# Patient Record
Sex: Female | Born: 1995 | Race: White | Hispanic: No | Marital: Married | Smoking: Current some day smoker
Health system: Southern US, Community
[De-identification: ages and names within clinical notes are randomized; demographics above are authoritative.]

## PROBLEM LIST (undated history)

## (undated) DIAGNOSIS — Z789 Other specified health status: Secondary | ICD-10-CM

## (undated) DIAGNOSIS — Z3490 Encounter for supervision of normal pregnancy, unspecified, unspecified trimester: Secondary | ICD-10-CM

## (undated) HISTORY — PX: NO PAST SURGERIES: SHX2092

## (undated) HISTORY — DX: Encounter for supervision of normal pregnancy, unspecified, unspecified trimester: Z34.90

## (undated) HISTORY — DX: Other specified health status: Z78.9

---
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
## 2015-01-21 IMAGING — US US TRANSVAGINAL NON-OB
1 series · 13 of 25 positions shown · non-contrast
Comparison: None

CLINICAL DATA: Pelvic pain. Elevated white cell count. Right lower
quadrant pain for 1 week.

EXAM:
TRANSABDOMINAL AND TRANSVAGINAL ULTRASOUND OF PELVIS
TECHNIQUE: Both transabdominal and transvaginal ultrasound examinations of the
pelvis were performed. Transabdominal technique was performed for
global imaging of the pelvis including uterus, ovaries, adnexal
regions, and pelvic cul-de-sac. It was necessary to proceed with
endovaginal exam following the transabdominal exam to visualize the
endometrium and ovaries.

[Series 1: us transvaginal non-ob · 0.15mm/px · 13 of 65 slices shown]
[im 1/65]
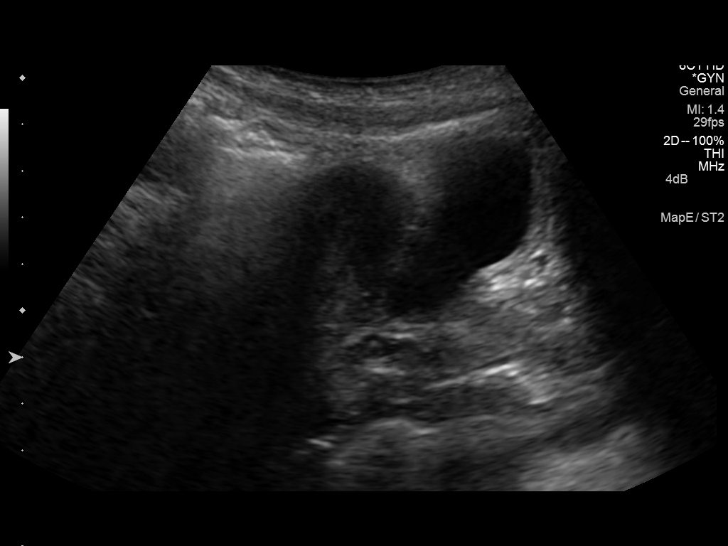
[im 6/65]
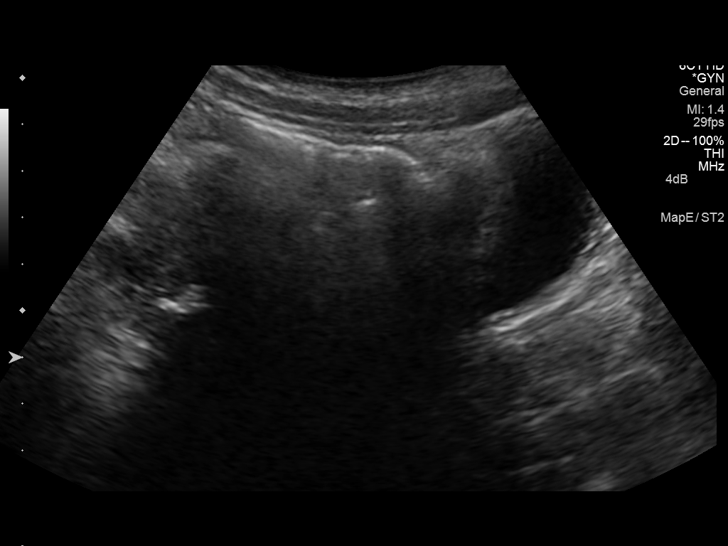
[im 11/65]
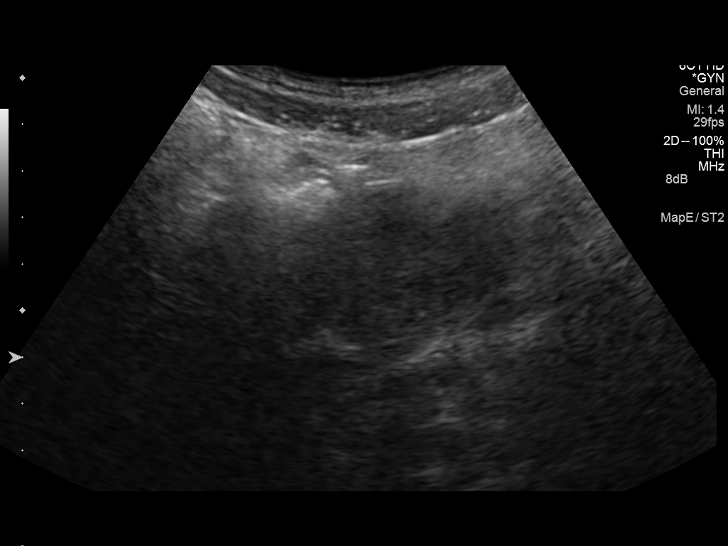
[im 17/65]
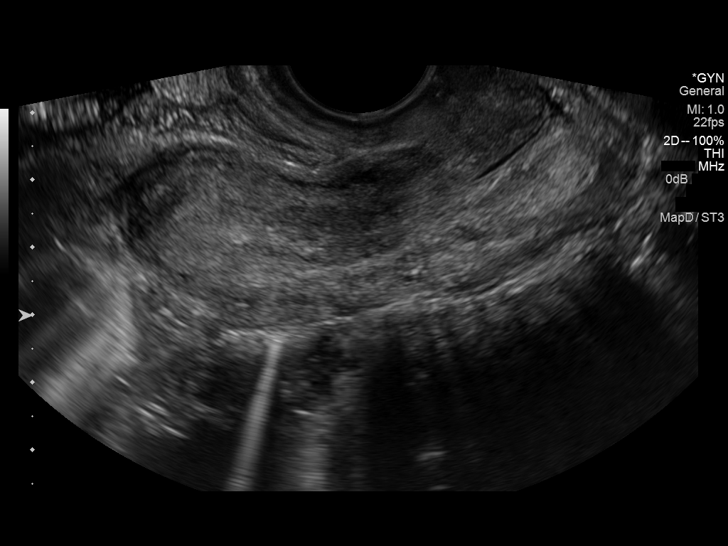
[im 22/65]
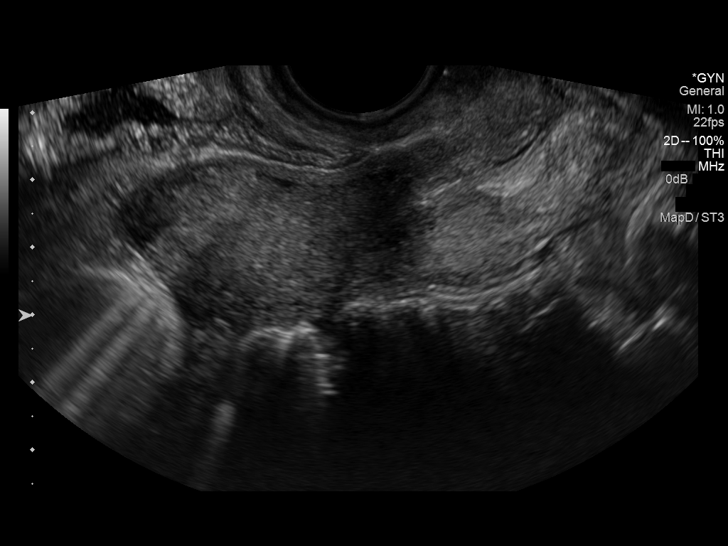
[im 27/65]
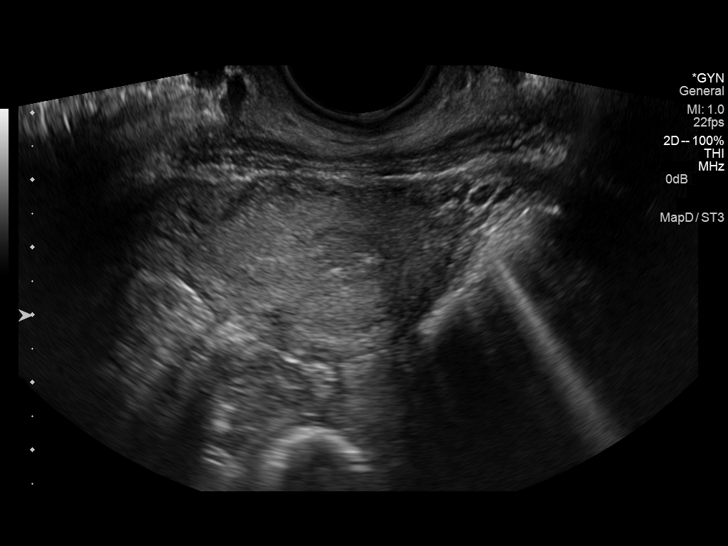
[im 33/65]
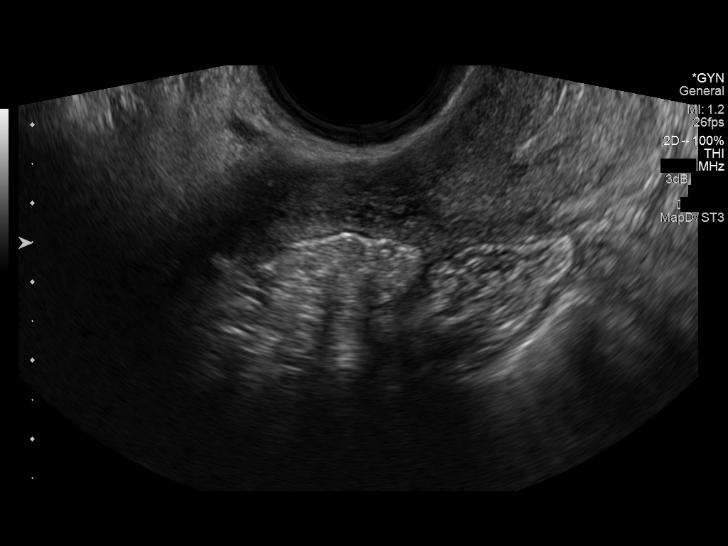
[im 38/65]
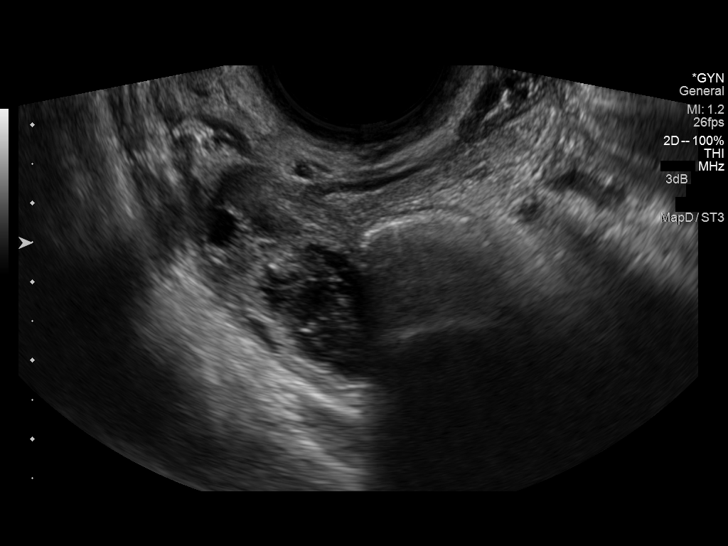
[im 43/65]
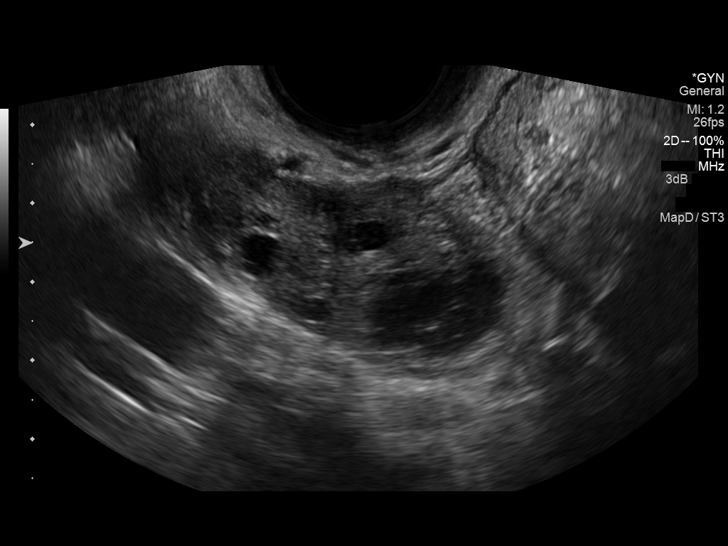
[im 49/65]
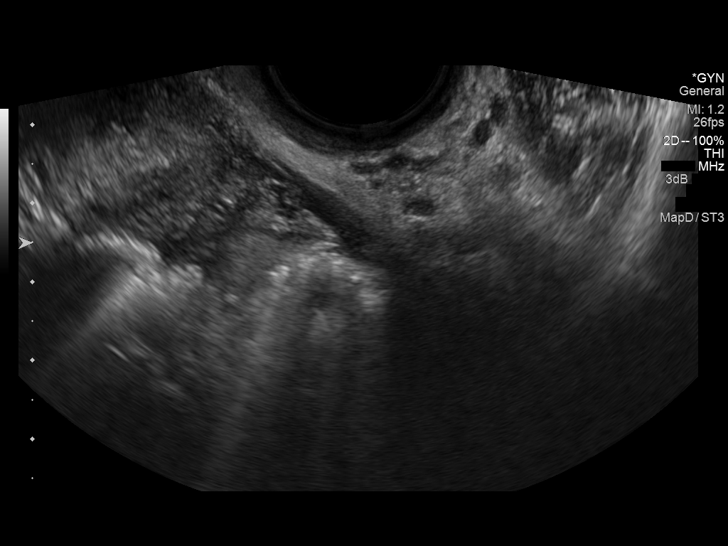
[im 54/65]
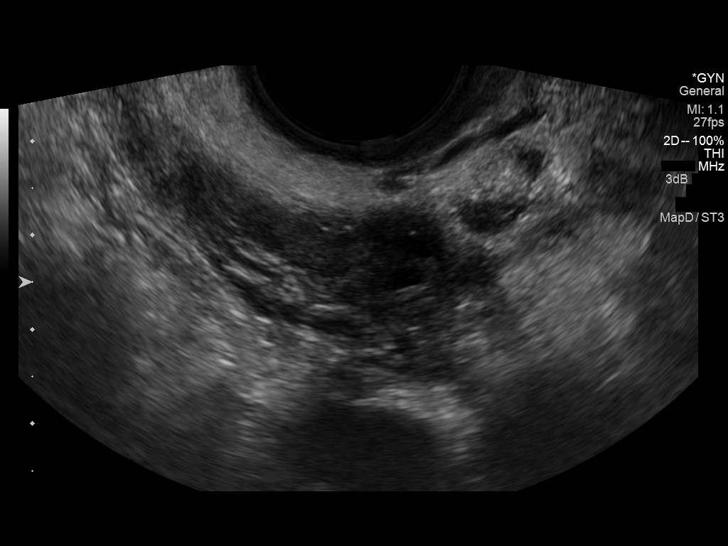
[im 59/65]
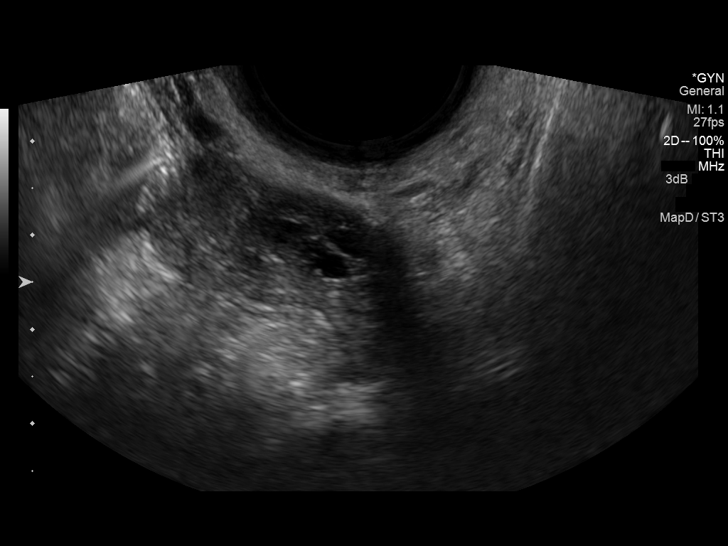
[im 65/65]
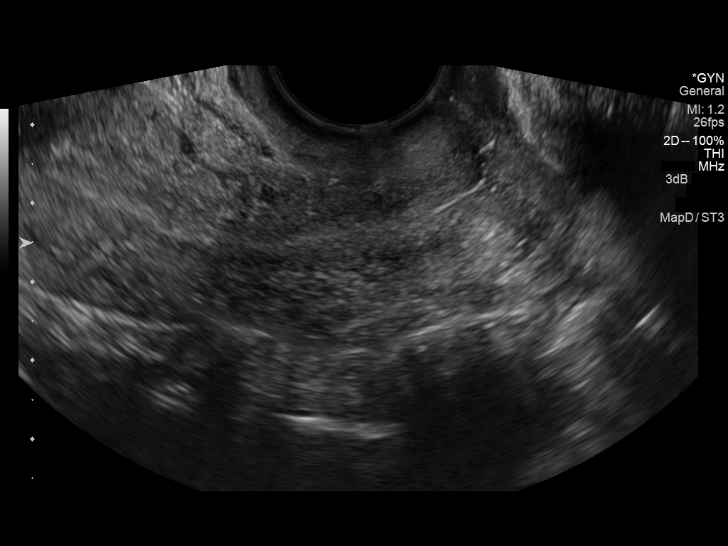

[13 of 25 positions shown; findings below may reference images not displayed]

FINDINGS: Measurements: 7.4 x 2.7 x 4.2 cm, anteverted. No fibroids or other
mass visualized.

Endometrium

Thickness: 6.4 mm.  No focal abnormality visualized.

Right ovary

Measurements: 4.3 x 2.2 x 2.5 cm. Dominant complex cyst or follicle
with diffuse low-level echoes measuring 2.2 cm maximal diameter.
This is likely a hemorrhagic cyst. Due to size and patient's age, no
follow-up necessary. Normal appearance/no adnexal mass.

Left ovary

Measurements: 3 x 1.8 x 1.8 cm.. Normal appearance/no adnexal mass.

Other findings

No free fluid.
IMPRESSION: Hemorrhagic cyst in the right ovary. Uterus and ovaries otherwise
normal.

## 2015-12-18 ENCOUNTER — Encounter: Payer: Self-pay | Admitting: Adult Health

## 2015-12-18 ENCOUNTER — Ambulatory Visit (INDEPENDENT_AMBULATORY_CARE_PROVIDER_SITE_OTHER): Payer: BLUE CROSS/BLUE SHIELD | Admitting: Adult Health

## 2015-12-18 VITALS — BP 100/50 | HR 92 | Ht 66.0 in | Wt 106.5 lb

## 2015-12-18 DIAGNOSIS — Z3490 Encounter for supervision of normal pregnancy, unspecified, unspecified trimester: Secondary | ICD-10-CM

## 2015-12-18 DIAGNOSIS — Z3201 Encounter for pregnancy test, result positive: Secondary | ICD-10-CM | POA: Diagnosis not present

## 2015-12-18 DIAGNOSIS — O3680X0 Pregnancy with inconclusive fetal viability, not applicable or unspecified: Secondary | ICD-10-CM

## 2015-12-18 HISTORY — DX: Encounter for supervision of normal pregnancy, unspecified, unspecified trimester: Z34.90

## 2015-12-18 LAB — POCT URINE PREGNANCY: Preg Test, Ur: POSITIVE — AB

## 2015-12-18 MED ORDER — PRENATAL PLUS 27-1 MG PO TABS: 1.0000 | ORAL_TABLET | Freq: Every day | ORAL | 12 refills | Status: DC

## 2015-12-18 NOTE — Progress Notes (Signed)
Subjective:     Patient ID: Joann Duncan Duncan, female   DOB: 12/02/1995, 20 y.o.   MRN: [id number]  HPI Joann Duncan is a recently married 20 year old white female in for UPT,she has no complaints.  Review of Systems Patient denies any headaches, hearing loss, fatigue, blurred vision, shortness of breath, chest pain, abdominal pain, problems with bowel movements, urination, or intercourse. No joint pain or mood swings.Reviewed past medical,surgical, social and family history. Reviewed medications and allergies.     Objective:   Physical Exam BP (!) 100/50 (BP Location: Left Arm, Patient Position: Sitting, Cuff Size: Normal)   Pulse 92   Ht 5\' 6"  (1.676 m)   Wt 106 lb 8 oz (48.3 kg)   LMP 10/20/2015   BMI 17.19 kg/m  UPT +,about 8+3 weeks by LMP with EDD 07/26/16, medicaid form given,Skin warm and dry. Neck: mid line trachea, normal thyroid, good ROM, no lymphadenopathy noted. Lungs: clear to ausculation bilaterally. Cardiovascular: regular rate and rhythm.Abdomen is soft and non tender.Try to starting decreasing cigarettes and then stop.    Assessment:     UPT + Pregnant     Plan:     Rx prenatal plus #30 take 1 daily with 1refill Return in 1 week for dating US Review handout on first trimester

## 2015-12-18 NOTE — Patient Instructions (Signed)
First Trimester of Pregnancy The first trimester of pregnancy is from week 1 until the end of week 12 (months 1 through 3). A week after a sperm fertilizes an egg, the egg will implant on the wall of the uterus. This embryo will begin to develop into a baby. Genes from you and your partner are forming the baby. The female genes determine whether the baby is a boy or a girl. At 6-8 weeks, the eyes and face are formed, and the heartbeat can be seen on ultrasound. At the end of 12 weeks, all the baby's organs are formed.  Now that you are pregnant, you will want to do everything you can to have a healthy baby. Two of the most important things are to get good prenatal care and to follow your health care provider's instructions. Prenatal care is all the medical care you receive before the baby's birth. This care will help prevent, find, and treat any problems during the pregnancy and childbirth. BODY CHANGES Your body goes through many changes during pregnancy. The changes vary from woman to woman.   You may gain or lose a couple of pounds at first.  You may feel sick to your stomach (nauseous) and throw up (vomit). If the vomiting is uncontrollable, call your health care provider.  You may tire easily.  You may develop headaches that can be relieved by medicines approved by your health care provider.  You may urinate more often. Painful urination may mean you have a bladder infection.  You may develop heartburn as a result of your pregnancy.  You may develop constipation because certain hormones are causing the muscles that push waste through your intestines to slow down.  You may develop hemorrhoids or swollen, bulging veins (varicose veins).  Your breasts may begin to grow larger and become tender. Your nipples may stick out more, and the tissue that surrounds them (areola) may become darker.  Your gums may bleed and may be sensitive to brushing and flossing.  Dark spots or blotches (chloasma,  mask of pregnancy) may develop on your face. This will likely fade after the baby is born.  Your menstrual periods will stop.  You may have a loss of appetite.  You may develop cravings for certain kinds of food.  You may have changes in your emotions from day to day, such as being excited to be pregnant or being concerned that something may go wrong with the pregnancy and baby.  You may have more vivid and strange dreams.  You may have changes in your hair. These can include thickening of your hair, rapid growth, and changes in texture. Some women also have hair loss during or after pregnancy, or hair that feels dry or thin. Your hair will most likely return to normal after your baby is born. WHAT TO EXPECT AT YOUR PRENATAL VISITS During a routine prenatal visit:  You will be weighed to make sure you and the baby are growing normally.  Your blood pressure will be taken.  Your abdomen will be measured to track your baby's growth.  The fetal heartbeat will be listened to starting around week 10 or 12 of your pregnancy.  Test results from any previous visits will be discussed. Your health care provider may ask you:  How you are feeling.  If you are feeling the baby move.  If you have had any abnormal symptoms, such as leaking fluid, bleeding, severe headaches, or abdominal cramping.  If you are using any tobacco products,   including cigarettes, chewing tobacco, and electronic cigarettes.  If you have any questions. Other tests that may be performed during your first trimester include:  Blood tests to find your blood type and to check for the presence of any previous infections. They will also be used to check for low iron levels (anemia) and Rh antibodies. Later in the pregnancy, blood tests for diabetes will be done along with other tests if problems develop.  Urine tests to check for infections, diabetes, or protein in the urine.  An ultrasound to confirm the proper growth  and development of the baby.  An amniocentesis to check for possible genetic problems.  Fetal screens for spina bifida and Down syndrome.  You may need other tests to make sure you and the baby are doing well.  HIV (human immunodeficiency virus) testing. Routine prenatal testing includes screening for HIV, unless you choose not to have this test. HOME CARE INSTRUCTIONS  Medicines  Follow your health care provider's instructions regarding medicine use. Specific medicines may be either safe or unsafe to take during pregnancy.  Take your prenatal vitamins as directed.  If you develop constipation, try taking a stool softener if your health care provider approves. Diet  Eat regular, well-balanced meals. Choose a variety of foods, such as meat or vegetable-based protein, fish, milk and low-fat dairy products, vegetables, fruits, and whole grain breads and cereals. Your health care provider will help you determine the amount of weight gain that is right for you.  Avoid raw meat and uncooked cheese. These carry germs that can cause birth defects in the baby.  Eating four or five small meals rather than three large meals a day may help relieve nausea and vomiting. If you start to feel nauseous, eating a few soda crackers can be helpful. Drinking liquids between meals instead of during meals also seems to help nausea and vomiting.  If you develop constipation, eat more high-fiber foods, such as fresh vegetables or fruit and whole grains. Drink enough fluids to keep your urine clear or pale yellow. Activity and Exercise  Exercise only as directed by your health care provider. Exercising will help you:  Control your weight.  Stay in shape.  Be prepared for labor and delivery.  Experiencing pain or cramping in the lower abdomen or low back is a good sign that you should stop exercising. Check with your health care provider before continuing normal exercises.  Try to avoid standing for long  periods of time. Move your legs often if you must stand in one place for a long time.  Avoid heavy lifting.  Wear low-heeled shoes, and practice good posture.  You may continue to have sex unless your health care provider directs you otherwise. Relief of Pain or Discomfort  Wear a good support bra for breast tenderness.   Take warm sitz baths to soothe any pain or discomfort caused by hemorrhoids. Use hemorrhoid cream if your health care provider approves.   Rest with your legs elevated if you have leg cramps or low back pain.  If you develop varicose veins in your legs, wear support hose. Elevate your feet for 15 minutes, 3-4 times a day. Limit salt in your diet. Prenatal Care  Schedule your prenatal visits by the twelfth week of pregnancy. They are usually scheduled monthly at first, then more often in the last 2 months before delivery.  Write down your questions. Take them to your prenatal visits.  Keep all your prenatal visits as directed by your   health care provider. Safety  Wear your seat belt at all times when driving.  Make a list of emergency phone numbers, including numbers for family, friends, the hospital, and police and fire departments. General Tips  Ask your health care provider for a referral to a local prenatal education class. Begin classes no later than at the beginning of month 6 of your pregnancy.  Ask for help if you have counseling or nutritional needs during pregnancy. Your health care provider can offer advice or refer you to specialists for help with various needs.  Do not use hot tubs, steam rooms, or saunas.  Do not douche or use tampons or scented sanitary pads.  Do not cross your legs for long periods of time.  Avoid cat litter boxes and soil used by cats. These carry germs that can cause birth defects in the baby and possibly loss of the fetus by miscarriage or stillbirth.  Avoid all smoking, herbs, alcohol, and medicines not prescribed by  your health care provider. Chemicals in these affect the formation and growth of the baby.  Do not use any tobacco products, including cigarettes, chewing tobacco, and electronic cigarettes. If you need help quitting, ask your health care provider. You may receive counseling support and other resources to help you quit.  Schedule a dentist appointment. At home, brush your teeth with a soft toothbrush and be gentle when you floss. SEEK MEDICAL CARE IF:   You have dizziness.  You have mild pelvic cramps, pelvic pressure, or nagging pain in the abdominal area.  You have persistent nausea, vomiting, or diarrhea.  You have a bad smelling vaginal discharge.  You have pain with urination.  You notice increased swelling in your face, hands, legs, or ankles. SEEK IMMEDIATE MEDICAL CARE IF:   You have a fever.  You are leaking fluid from your vagina.  You have spotting or bleeding from your vagina.  You have severe abdominal cramping or pain.  You have rapid weight gain or loss.  You vomit blood or material that looks like coffee grounds.  You are exposed to German measles and have never had them.  You are exposed to fifth disease or chickenpox.  You develop a severe headache.  You have shortness of breath.  You have any kind of trauma, such as from a fall or a car accident.   This information is not intended to replace advice given to you by your health care provider. Make sure you discuss any questions you have with your health care provider.   Document Released: 04/15/2001 Document Revised: 05/12/2014 Document Reviewed: 03/01/2013 Elsevier Interactive Patient Education 2016 [employer] Return in 1 week for [place] 

## 2015-12-24 ENCOUNTER — Ambulatory Visit (INDEPENDENT_AMBULATORY_CARE_PROVIDER_SITE_OTHER): Payer: BLUE CROSS/BLUE SHIELD

## 2015-12-24 DIAGNOSIS — Z3A01 Less than 8 weeks gestation of pregnancy: Secondary | ICD-10-CM

## 2015-12-24 DIAGNOSIS — O3680X0 Pregnancy with inconclusive fetal viability, not applicable or unspecified: Secondary | ICD-10-CM | POA: Diagnosis not present

## 2015-12-24 NOTE — Progress Notes (Signed)
US 6+2 wks,single IUP w/ys,pos fht 129 bpm,normal rt ov,unable to see lt ov,crl 6.0 mm

## 2015-12-25 ENCOUNTER — Ambulatory Visit (INDEPENDENT_AMBULATORY_CARE_PROVIDER_SITE_OTHER): Payer: BLUE CROSS/BLUE SHIELD | Admitting: Women's Health

## 2015-12-25 ENCOUNTER — Encounter: Payer: Self-pay | Admitting: Women's Health

## 2015-12-25 VITALS — BP 124/72 | HR 84 | Wt 107.0 lb

## 2015-12-25 DIAGNOSIS — Z3481 Encounter for supervision of other normal pregnancy, first trimester: Secondary | ICD-10-CM | POA: Diagnosis not present

## 2015-12-25 DIAGNOSIS — Z1389 Encounter for screening for other disorder: Secondary | ICD-10-CM

## 2015-12-25 DIAGNOSIS — Z0283 Encounter for blood-alcohol and blood-drug test: Secondary | ICD-10-CM

## 2015-12-25 DIAGNOSIS — O99331 Smoking (tobacco) complicating pregnancy, first trimester: Secondary | ICD-10-CM | POA: Diagnosis not present

## 2015-12-25 DIAGNOSIS — F17200 Nicotine dependence, unspecified, uncomplicated: Secondary | ICD-10-CM | POA: Insufficient documentation

## 2015-12-25 DIAGNOSIS — Z3A01 Less than 8 weeks gestation of pregnancy: Secondary | ICD-10-CM | POA: Diagnosis not present

## 2015-12-25 DIAGNOSIS — Z369 Encounter for antenatal screening, unspecified: Secondary | ICD-10-CM

## 2015-12-25 DIAGNOSIS — Z3491 Encounter for supervision of normal pregnancy, unspecified, first trimester: Secondary | ICD-10-CM

## 2015-12-25 DIAGNOSIS — F17210 Nicotine dependence, cigarettes, uncomplicated: Secondary | ICD-10-CM

## 2015-12-25 DIAGNOSIS — Z3490 Encounter for supervision of normal pregnancy, unspecified, unspecified trimester: Secondary | ICD-10-CM | POA: Insufficient documentation

## 2015-12-25 DIAGNOSIS — Z331 Pregnant state, incidental: Secondary | ICD-10-CM

## 2015-12-25 LAB — POCT URINALYSIS DIPSTICK
Blood, UA: NEGATIVE
Glucose, UA: NEGATIVE
KETONES UA: NEGATIVE
Leukocytes, UA: NEGATIVE
Nitrite, UA: NEGATIVE

## 2015-12-25 NOTE — Progress Notes (Signed)
  Subjective:  Joann Duncan is a 20 y.o. 643P0020 Caucasian female at 7032w3d by 6wk u/s, being seen today for her first obstetrical visit.  Her obstetrical history is significant for SAB x 2, smoker: 1/2ppd prior to pregnancy, now ~2 cigarettes/day. Pregnancy history fully reviewed.  Patient reports no complaints. Denies vb, cramping, uti s/s, abnormal/malodorous vag d/c, or vulvovaginal itching/irritation.  BP 124/72   Pulse 84   Wt 107 lb (48.5 kg)   LMP 10/20/2015 (Exact Date)   BMI 17.27 kg/m   HISTORY: OB History  Gravida Para Term Preterm AB Living  3       2    SAB TAB Ectopic Multiple Live Births  2            # Outcome Date GA Lbr Len/2nd Weight Sex Delivery Anes PTL Lv  3 Current           2 SAB 05/01/15          1 SAB 02/16/15             Past Medical History:  Diagnosis Date  . Medical history non-contributory   . Pregnant 12/18/2015   Past Surgical History:  Procedure Laterality Date  . NO PAST SURGERIES     Family History  Problem Relation Age of Onset  . Cancer Paternal Grandfather     Exam   System:     General: Well developed & nourished, no acute distress   Skin: Warm & dry, normal coloration and turgor, no rashes   Neurologic: Alert & oriented, normal mood   Cardiovascular: Regular rate & rhythm   Respiratory: Effort & rate normal, LCTAB, acyanotic   Abdomen: Soft, non tender   Extremities: normal strength, tone   Pelvic Exam:    Perineum: Normal perineum   Vulva: Normal, no lesions   Vagina:  Normal mucosa, normal discharge   Cervix: Normal, bulbous, appears closed   Uterus: Normal size/shape/contour for GA   Thin prep pap smear <21yo    Assessment:   Pregnancy: [id number] Patient Active Problem List   Diagnosis Date Noted  . Pregnant 12/18/2015    2532w3d G3P0020 New OB visit Smoker H/O SAB x 2  Plan:  Initial labs drawn Continue prenatal vitamins Problem list reviewed and updated Reviewed n/v relief measures and warning s/s  to report Reviewed recommended weight gain based on pre-gravid BMI Encouraged well-balanced diet Genetic Screening discussed Integrated Screen: requested Cystic fibrosis screening discussed declined Ultrasound discussed; fetal survey: requested Follow up in 4 weeks for visit [facility] completed NFPartnership offered, accepted, referral faxed  Smokes 2 cigarettes/day, advised cessation, discussed risks to fetus while pregnant, to infant pp, and to herself.   Joann Duncan, Joann Duncan CNM, [facility]NP[facility]BC 12/25/2015 9:07 AM

## 2015-12-25 NOTE — Patient Instructions (Signed)

## 2015-12-26 ENCOUNTER — Encounter: Payer: Self-pay | Admitting: Women's Health

## 2015-12-26 DIAGNOSIS — O9989 Other specified diseases and conditions complicating pregnancy, childbirth and the puerperium: Secondary | ICD-10-CM

## 2015-12-26 DIAGNOSIS — O09899 Supervision of other high risk pregnancies, unspecified trimester: Secondary | ICD-10-CM | POA: Insufficient documentation

## 2015-12-26 DIAGNOSIS — Z283 Underimmunization status: Secondary | ICD-10-CM | POA: Insufficient documentation

## 2015-12-26 LAB — GC/CHLAMYDIA PROBE AMP
Chlamydia trachomatis, NAA: NEGATIVE
Neisseria gonorrhoeae by PCR: NEGATIVE

## 2015-12-26 LAB — URINE CULTURE

## 2015-12-27 LAB — PMP SCREEN PROFILE (10S), URINE
Amphetamine Screen, Ur: NEGATIVE ng/mL
BENZODIAZEPINE SCREEN, URINE: NEGATIVE ng/mL
Barbiturate Screen, Ur: NEGATIVE ng/mL
CANNABINOIDS UR QL SCN: NEGATIVE ng/mL
Cocaine(Metab.)Screen, Urine: NEGATIVE ng/mL
Creatinine(Crt), U: 347.1 mg/dL — ABNORMAL HIGH (ref 20.0–300.0)
METHADONE SCREEN, URINE: NEGATIVE ng/mL
Opiate Scrn, Ur: NEGATIVE ng/mL
Oxycodone+Oxymorphone Ur Ql Scn: NEGATIVE ng/mL
PCP Scrn, Ur: NEGATIVE ng/mL
Ph of Urine: 5.8 (ref 4.5–8.9)
Propoxyphene, Screen: NEGATIVE ng/mL

## 2015-12-27 LAB — URINALYSIS, ROUTINE W REFLEX MICROSCOPIC
Bilirubin, UA: NEGATIVE
GLUCOSE, UA: NEGATIVE
Ketones, UA: NEGATIVE
Leukocytes, UA: NEGATIVE
Nitrite, UA: NEGATIVE
RBC, UA: NEGATIVE
Specific Gravity, UA: >=1.03 — AB (ref 1.005–1.030)
UUROB: 0.2 mg/dL (ref 0.2–1.0)
pH, UA: 6 (ref 5.0–7.5)

## 2015-12-27 LAB — CBC
Hematocrit: 39 % (ref 34.0–46.6)
Hemoglobin: 13.1 g/dL (ref 11.1–15.9)
MCH: 30.5 pg (ref 26.6–33.0)
MCHC: 33.6 g/dL (ref 31.5–35.7)
MCV: 91 fL (ref 79–97)
PLATELETS: 320 10*3/uL (ref 150–379)
RBC: 4.29 x10E6/uL (ref 3.77–5.28)
RDW: 12.4 % (ref 12.3–15.4)
WBC: 8.7 10*3/uL (ref 3.4–10.8)

## 2015-12-27 LAB — ANTIBODY SCREEN: ANTIBODY SCREEN: NEGATIVE

## 2015-12-27 LAB — ABO/RH: RH TYPE: POSITIVE

## 2015-12-27 LAB — HEPATITIS B SURFACE ANTIGEN: Hepatitis B Surface Ag: NEGATIVE

## 2015-12-27 LAB — RUBELLA SCREEN: Rubella Antibodies, IGG: <0.9 index — ABNORMAL LOW (ref >0.99)

## 2015-12-27 LAB — RPR: RPR Ser Ql: NONREACTIVE

## 2015-12-27 LAB — VARICELLA ZOSTER ANTIBODY, IGG: VARICELLA: 2001 {index} (ref >165)

## 2015-12-27 LAB — HIV ANTIBODY (ROUTINE TESTING W REFLEX): HIV SCREEN 4TH GENERATION: NONREACTIVE

## 2016-01-22 ENCOUNTER — Ambulatory Visit (INDEPENDENT_AMBULATORY_CARE_PROVIDER_SITE_OTHER): Payer: BLUE CROSS/BLUE SHIELD | Admitting: Women's Health

## 2016-01-22 ENCOUNTER — Encounter: Payer: Self-pay | Admitting: Women's Health

## 2016-01-22 VITALS — BP 126/62 | HR 82 | Wt 109.4 lb

## 2016-01-22 DIAGNOSIS — Z3481 Encounter for supervision of other normal pregnancy, first trimester: Secondary | ICD-10-CM

## 2016-01-22 DIAGNOSIS — O99331 Smoking (tobacco) complicating pregnancy, first trimester: Secondary | ICD-10-CM

## 2016-01-22 DIAGNOSIS — Z3491 Encounter for supervision of normal pregnancy, unspecified, first trimester: Secondary | ICD-10-CM

## 2016-01-22 DIAGNOSIS — Z1389 Encounter for screening for other disorder: Secondary | ICD-10-CM

## 2016-01-22 DIAGNOSIS — Z331 Pregnant state, incidental: Secondary | ICD-10-CM

## 2016-01-22 DIAGNOSIS — Z3682 Encounter for antenatal screening for nuchal translucency: Secondary | ICD-10-CM

## 2016-01-22 DIAGNOSIS — Z3A10 10 weeks gestation of pregnancy: Secondary | ICD-10-CM

## 2016-01-22 LAB — POCT URINALYSIS DIPSTICK
Blood, UA: NEGATIVE
Glucose, UA: NEGATIVE
Ketones, UA: NEGATIVE
LEUKOCYTES UA: NEGATIVE
NITRITE UA: NEGATIVE
PROTEIN UA: NEGATIVE

## 2016-01-22 NOTE — Patient Instructions (Signed)
First Trimester of Pregnancy The first trimester of pregnancy is from week 1 until the end of week 12 (months 1 through 3). A week after a sperm fertilizes an egg, the egg will implant on the wall of the uterus. This embryo will begin to develop into a baby. Genes from you and your partner are forming the baby. The female genes determine whether the baby is a boy or a girl. At 6-8 weeks, the eyes and face are formed, and the heartbeat can be seen on ultrasound. At the end of 12 weeks, all the baby's organs are formed.  Now that you are pregnant, you will want to do everything you can to have a healthy baby. Two of the most important things are to get good prenatal care and to follow your health care provider's instructions. Prenatal care is all the medical care you receive before the baby's birth. This care will help prevent, find, and treat any problems during the pregnancy and childbirth. BODY CHANGES Your body goes through many changes during pregnancy. The changes vary from woman to woman.   You may gain or lose a couple of pounds at first.  You may feel sick to your stomach (nauseous) and throw up (vomit). If the vomiting is uncontrollable, call your health care provider.  You may tire easily.  You may develop headaches that can be relieved by medicines approved by your health care provider.  You may urinate more often. Painful urination may mean you have a bladder infection.  You may develop heartburn as a result of your pregnancy.  You may develop constipation because certain hormones are causing the muscles that push waste through your intestines to slow down.  You may develop hemorrhoids or swollen, bulging veins (varicose veins).  Your breasts may begin to grow larger and become tender. Your nipples may stick out more, and the tissue that surrounds them (areola) may become darker.  Your gums may bleed and may be sensitive to brushing and flossing.  Dark spots or blotches (chloasma,  mask of pregnancy) may develop on your face. This will likely fade after the baby is born.  Your menstrual periods will stop.  You may have a loss of appetite.  You may develop cravings for certain kinds of food.  You may have changes in your emotions from day to day, such as being excited to be pregnant or being concerned that something may go wrong with the pregnancy and baby.  You may have more vivid and strange dreams.  You may have changes in your hair. These can include thickening of your hair, rapid growth, and changes in texture. Some women also have hair loss during or after pregnancy, or hair that feels dry or thin. Your hair will most likely return to normal after your baby is born. WHAT TO EXPECT AT YOUR PRENATAL VISITS During a routine prenatal visit:  You will be weighed to make sure you and the baby are growing normally.  Your blood pressure will be taken.  Your abdomen will be measured to track your baby's growth.  The fetal heartbeat will be listened to starting around week 10 or 12 of your pregnancy.  Test results from any previous visits will be discussed. Your health care provider may ask you:  How you are feeling.  If you are feeling the baby move.  If you have had any abnormal symptoms, such as leaking fluid, bleeding, severe headaches, or abdominal cramping.  If you are using any tobacco products,   including cigarettes, chewing tobacco, and electronic cigarettes.  If you have any questions. Other tests that may be performed during your first trimester include:  Blood tests to find your blood type and to check for the presence of any previous infections. They will also be used to check for low iron levels (anemia) and Rh antibodies. Later in the pregnancy, blood tests for diabetes will be done along with other tests if problems develop.  Urine tests to check for infections, diabetes, or protein in the urine.  An ultrasound to confirm the proper growth  and development of the baby.  An amniocentesis to check for possible genetic problems.  Fetal screens for spina bifida and Down syndrome.  You may need other tests to make sure you and the baby are doing well.  HIV (human immunodeficiency virus) testing. Routine prenatal testing includes screening for HIV, unless you choose not to have this test. HOME CARE INSTRUCTIONS  Medicines  Follow your health care provider's instructions regarding medicine use. Specific medicines may be either safe or unsafe to take during pregnancy.  Take your prenatal vitamins as directed.  If you develop constipation, try taking a stool softener if your health care provider approves. Diet  Eat regular, well-balanced meals. Choose a variety of foods, such as meat or vegetable-based protein, fish, milk and low-fat dairy products, vegetables, fruits, and whole grain breads and cereals. Your health care provider will help you determine the amount of weight gain that is right for you.  Avoid raw meat and uncooked cheese. These carry germs that can cause birth defects in the baby.  Eating four or five small meals rather than three large meals a day may help relieve nausea and vomiting. If you start to feel nauseous, eating a few soda crackers can be helpful. Drinking liquids between meals instead of during meals also seems to help nausea and vomiting.  If you develop constipation, eat more high-fiber foods, such as fresh vegetables or fruit and whole grains. Drink enough fluids to keep your urine clear or pale yellow. Activity and Exercise  Exercise only as directed by your health care provider. Exercising will help you:  Control your weight.  Stay in shape.  Be prepared for labor and delivery.  Experiencing pain or cramping in the lower abdomen or low back is a good sign that you should stop exercising. Check with your health care provider before continuing normal exercises.  Try to avoid standing for long  periods of time. Move your legs often if you must stand in one place for a long time.  Avoid heavy lifting.  Wear low-heeled shoes, and practice good posture.  You may continue to have sex unless your health care provider directs you otherwise. Relief of Pain or Discomfort  Wear a good support bra for breast tenderness.   Take warm sitz baths to soothe any pain or discomfort caused by hemorrhoids. Use hemorrhoid cream if your health care provider approves.   Rest with your legs elevated if you have leg cramps or low back pain.  If you develop varicose veins in your legs, wear support hose. Elevate your feet for 15 minutes, 3-4 times a day. Limit salt in your diet. Prenatal Care  Schedule your prenatal visits by the twelfth week of pregnancy. They are usually scheduled monthly at first, then more often in the last 2 months before delivery.  Write down your questions. Take them to your prenatal visits.  Keep all your prenatal visits as directed by your   health care provider. Safety  Wear your seat belt at all times when driving.  Make a list of emergency phone numbers, including numbers for family, friends, the hospital, and police and fire departments. General Tips  Ask your health care provider for a referral to a local prenatal education class. Begin classes no later than at the beginning of month 6 of your pregnancy.  Ask for help if you have counseling or nutritional needs during pregnancy. Your health care provider can offer advice or refer you to specialists for help with various needs.  Do not use hot tubs, steam rooms, or saunas.  Do not douche or use tampons or scented sanitary pads.  Do not cross your legs for long periods of time.  Avoid cat litter boxes and soil used by cats. These carry germs that can cause birth defects in the baby and possibly loss of the fetus by miscarriage or stillbirth.  Avoid all smoking, herbs, alcohol, and medicines not prescribed by  your health care provider. Chemicals in these affect the formation and growth of the baby.  Do not use any tobacco products, including cigarettes, chewing tobacco, and electronic cigarettes. If you need help quitting, ask your health care provider. You may receive counseling support and other resources to help you quit.  Schedule a dentist appointment. At home, brush your teeth with a soft toothbrush and be gentle when you floss. SEEK MEDICAL CARE IF:   You have dizziness.  You have mild pelvic cramps, pelvic pressure, or nagging pain in the abdominal area.  You have persistent nausea, vomiting, or diarrhea.  You have a bad smelling vaginal discharge.  You have pain with urination.  You notice increased swelling in your face, hands, legs, or ankles. SEEK IMMEDIATE MEDICAL CARE IF:   You have a fever.  You are leaking fluid from your vagina.  You have spotting or bleeding from your vagina.  You have severe abdominal cramping or pain.  You have rapid weight gain or loss.  You vomit blood or material that looks like coffee grounds.  You are exposed to German measles and have never had them.  You are exposed to fifth disease or chickenpox.  You develop a severe headache.  You have shortness of breath.  You have any kind of trauma, such as from a fall or a car accident.   This information is not intended to replace advice given to you by your health care provider. Make sure you discuss any questions you have with your health care provider.   Document Released: 04/15/2001 Document Revised: 05/12/2014 Document Reviewed: 03/01/2013 Elsevier Interactive Patient Education 2016 [employer] Inc.  

## 2016-01-22 NOTE — Progress Notes (Signed)
Low-risk OB appointment [id number] 4531w3d Estimated Date of Delivery: 08/16/16 BP 126/62   Pulse 82   Wt 109 lb 6.4 oz (49.6 kg)   LMP 10/20/2015 (Exact Date)   BMI 17.66 kg/m   BP, weight, and urine reviewed.  Refer to obstetrical flow sheet for FH & FHR.  No fm yet. Denies cramping, lof, vb, or uti s/s. No complaints. Doing better w/ smoking- none while she's working 12hr shifts, then maybe 1-2 on days she's off Reviewed warning s/s to report. Plan:  Continue routine obstetrical care  F/U in 2wks for OB appointment and 1st IT/NT Declines flu shot today, maybe next time

## 2016-02-05 ENCOUNTER — Ambulatory Visit (INDEPENDENT_AMBULATORY_CARE_PROVIDER_SITE_OTHER): Payer: BLUE CROSS/BLUE SHIELD

## 2016-02-05 ENCOUNTER — Ambulatory Visit (INDEPENDENT_AMBULATORY_CARE_PROVIDER_SITE_OTHER): Payer: BLUE CROSS/BLUE SHIELD | Admitting: Obstetrics & Gynecology

## 2016-02-05 ENCOUNTER — Encounter: Payer: Self-pay | Admitting: Obstetrics & Gynecology

## 2016-02-05 ENCOUNTER — Encounter: Payer: BLUE CROSS/BLUE SHIELD | Admitting: Obstetrics & Gynecology

## 2016-02-05 VITALS — BP 100/60 | HR 92 | Wt 108.0 lb

## 2016-02-05 DIAGNOSIS — Z1389 Encounter for screening for other disorder: Secondary | ICD-10-CM

## 2016-02-05 DIAGNOSIS — Z3682 Encounter for antenatal screening for nuchal translucency: Secondary | ICD-10-CM | POA: Diagnosis not present

## 2016-02-05 DIAGNOSIS — Z3401 Encounter for supervision of normal first pregnancy, first trimester: Secondary | ICD-10-CM

## 2016-02-05 DIAGNOSIS — Z331 Pregnant state, incidental: Secondary | ICD-10-CM

## 2016-02-05 DIAGNOSIS — Z3A13 13 weeks gestation of pregnancy: Secondary | ICD-10-CM

## 2016-02-05 DIAGNOSIS — Z3402 Encounter for supervision of normal first pregnancy, second trimester: Secondary | ICD-10-CM

## 2016-02-05 LAB — POCT URINALYSIS DIPSTICK
GLUCOSE UA: NEGATIVE
Ketones, UA: NEGATIVE
Leukocytes, UA: NEGATIVE
NITRITE UA: NEGATIVE
RBC UA: NEGATIVE

## 2016-02-05 NOTE — Progress Notes (Signed)
US 12+3 wks,measurement c/w dates,crl 61.9 mm,NB present,NT 1.2 mm,normal ov's bilat,fhr 163 bpm

## 2016-02-05 NOTE — Progress Notes (Signed)
[id number] 524w3d Estimated Date of Delivery: 08/16/16  Blood pressure 100/60, pulse 92, weight 108 lb (49 kg), last menstrual period 10/20/2015.   BP weight and urine results all reviewed and noted.  Please refer to the obstetrical flow sheet for the fundal height and fetal heart rate documentation:  Patient reports good fetal movement, denies any bleeding and no rupture of membranes symptoms or regular contractions. Patient is without complaints. All questions were answered.  Orders Placed This Encounter  Procedures  . Maternal Screen, Integrated #1  . POCT urinalysis dipstick    Plan:  Continued routine obstetrical care, NT normal, 1st IT today  Return in about 4 weeks (around 03/04/2016) for LROB.

## 2016-02-08 LAB — MATERNAL SCREEN, INTEGRATED #1
Crown Rump Length: 61.9 mm
Gest. Age on Collection Date: 12.6 wk
Maternal Age at EDD: 20.6 a
Nuchal Translucency (NT): 1.2 mm
Number of Fetuses: 1
PAPP-A Value: 719.6 ng/mL
Weight: 108 [lb_av]

## 2016-03-04 ENCOUNTER — Ambulatory Visit (INDEPENDENT_AMBULATORY_CARE_PROVIDER_SITE_OTHER): Payer: BLUE CROSS/BLUE SHIELD | Admitting: Women's Health

## 2016-03-04 VITALS — BP 108/50 | HR 90 | Wt 114.2 lb

## 2016-03-04 DIAGNOSIS — Z1389 Encounter for screening for other disorder: Secondary | ICD-10-CM

## 2016-03-04 DIAGNOSIS — Z331 Pregnant state, incidental: Secondary | ICD-10-CM

## 2016-03-04 DIAGNOSIS — Z3A17 17 weeks gestation of pregnancy: Secondary | ICD-10-CM

## 2016-03-04 DIAGNOSIS — Z3682 Encounter for antenatal screening for nuchal translucency: Secondary | ICD-10-CM

## 2016-03-04 DIAGNOSIS — Z363 Encounter for antenatal screening for malformations: Secondary | ICD-10-CM

## 2016-03-04 DIAGNOSIS — Z3482 Encounter for supervision of other normal pregnancy, second trimester: Secondary | ICD-10-CM

## 2016-03-04 LAB — POCT URINALYSIS DIPSTICK
Blood, UA: NEGATIVE
Glucose, UA: NEGATIVE
KETONES UA: NEGATIVE
Leukocytes, UA: NEGATIVE
Nitrite, UA: NEGATIVE
Protein, UA: NEGATIVE

## 2016-03-04 NOTE — Patient Instructions (Signed)

## 2016-03-04 NOTE — Progress Notes (Signed)
Low-risk OB appointment G3P0020 5849w3d Estimated Date of Delivery: 08/16/16 BP (!) 108/50   Pulse 90   Wt 114 lb 3.2 oz (51.8 kg)   LMP 10/20/2015 (Exact Date)   BMI 18.43 kg/m   BP, weight, and urine reviewed.  Refer to obstetrical flow sheet for FH & FHR.  No fm yet. Denies cramping, lof, vb, or uti s/s. No complaints. Reviewed warning s/s to report. Recommended flu shot w/ pcp/hd (<21yo)  Plan:  Continue routine obstetrical care  F/U in 4wks for OB appointment and anatomy u/s  2nd IT today

## 2016-03-06 LAB — MATERNAL SCREEN, INTEGRATED #2
AFP MARKER: 31.4 ng/mL
AFP MOM: 0.81
CROWN RUMP LENGTH: 61.9 mm
DIA MoM: 1.03
DIA Value: 213.9 pg/mL
Estriol, Unconjugated: 0.66 ng/mL
GESTATIONAL AGE: 16.6 wk
Gest. Age on Collection Date: 12.6 weeks
Maternal Age at EDD: 20.6 years
Nuchal Translucency (NT): 1.2 mm
Nuchal Translucency MoM: 0.77
Number of Fetuses: 1
PAPP-A MoM: 0.5
PAPP-A Value: 719.6 ng/mL
TEST RESULTS: NEGATIVE
WEIGHT: 114 [lb_av]
Weight: 108 [lb_av]
hCG MoM: 0.88
hCG Value: 31 IU/mL
uE3 MoM: 0.68

## 2016-04-01 ENCOUNTER — Encounter: Payer: Self-pay | Admitting: Obstetrics & Gynecology

## 2016-04-01 ENCOUNTER — Ambulatory Visit (INDEPENDENT_AMBULATORY_CARE_PROVIDER_SITE_OTHER): Payer: BLUE CROSS/BLUE SHIELD

## 2016-04-01 ENCOUNTER — Ambulatory Visit (INDEPENDENT_AMBULATORY_CARE_PROVIDER_SITE_OTHER): Payer: BLUE CROSS/BLUE SHIELD | Admitting: Obstetrics & Gynecology

## 2016-04-01 VITALS — BP 110/70 | HR 72 | Wt 115.0 lb

## 2016-04-01 DIAGNOSIS — Z3482 Encounter for supervision of other normal pregnancy, second trimester: Secondary | ICD-10-CM

## 2016-04-01 DIAGNOSIS — Z363 Encounter for antenatal screening for malformations: Secondary | ICD-10-CM

## 2016-04-01 DIAGNOSIS — Z3A21 21 weeks gestation of pregnancy: Secondary | ICD-10-CM

## 2016-04-01 DIAGNOSIS — Z3402 Encounter for supervision of normal first pregnancy, second trimester: Secondary | ICD-10-CM

## 2016-04-01 DIAGNOSIS — Z1389 Encounter for screening for other disorder: Secondary | ICD-10-CM

## 2016-04-01 DIAGNOSIS — Z331 Pregnant state, incidental: Secondary | ICD-10-CM

## 2016-04-01 LAB — POCT URINALYSIS DIPSTICK
GLUCOSE UA: NEGATIVE
Ketones, UA: NEGATIVE
Leukocytes, UA: NEGATIVE
Nitrite, UA: NEGATIVE
Protein, UA: NEGATIVE
RBC UA: NEGATIVE

## 2016-04-01 NOTE — Progress Notes (Addendum)
US 20+3 wks,cephalic,ant pl gr 0,cx 3.3 cm,normal lt ov,unable to visualize rt ov,svp of fluid 4.2 cm,fhr 144 bpm,efw 343 g,anatomy complete,no obvious abnormalities seen

## 2016-04-01 NOTE — Progress Notes (Signed)
[id number] 3927w3d Estimated Date of Delivery: 08/16/16  Blood pressure 110/70, pulse 72, weight 115 lb (52.2 kg), last menstrual period 10/20/2015.   BP weight and urine results all reviewed and noted.  Please refer to the obstetrical flow sheet for the fundal height and fetal heart rate documentation:  Patient reports good fetal movement, denies any bleeding and no rupture of membranes symptoms or regular contractions. Patient is without complaints. All questions were answered.  Orders Placed This Encounter  Procedures  . POCT urinalysis dipstick    Plan:  Continued routine obstetrical care, sonogram is normal pending re eval of cerebellum and to lip  No Follow-up on file.

## 2016-04-30 ENCOUNTER — Encounter: Payer: Self-pay | Admitting: Women's Health

## 2016-04-30 ENCOUNTER — Ambulatory Visit (INDEPENDENT_AMBULATORY_CARE_PROVIDER_SITE_OTHER): Payer: BLUE CROSS/BLUE SHIELD | Admitting: Women's Health

## 2016-04-30 VITALS — BP 122/62 | HR 92 | Wt 119.0 lb

## 2016-04-30 DIAGNOSIS — Z3A25 25 weeks gestation of pregnancy: Secondary | ICD-10-CM

## 2016-04-30 DIAGNOSIS — Z3482 Encounter for supervision of other normal pregnancy, second trimester: Secondary | ICD-10-CM

## 2016-04-30 DIAGNOSIS — Z1389 Encounter for screening for other disorder: Secondary | ICD-10-CM

## 2016-04-30 DIAGNOSIS — R82998 Other abnormal findings in urine: Secondary | ICD-10-CM

## 2016-04-30 DIAGNOSIS — Z331 Pregnant state, incidental: Secondary | ICD-10-CM

## 2016-04-30 DIAGNOSIS — R8299 Other abnormal findings in urine: Secondary | ICD-10-CM

## 2016-04-30 LAB — POCT URINALYSIS DIPSTICK
Blood, UA: NEGATIVE
Glucose, UA: NEGATIVE
Ketones, UA: NEGATIVE
Nitrite, UA: NEGATIVE

## 2016-04-30 NOTE — Patient Instructions (Signed)
You will have your sugar test next visit.  Please do not eat or drink anything after midnight the night before you come, not even water.  You will be here for at least two hours.     Call the office ([phone]) or go to [facility] if:  You begin to have strong, frequent contractions  Your water breaks.  Sometimes it is a big gush of fluid, sometimes it is just a trickle that keeps getting your panties wet or running down your legs  You have vaginal bleeding.  It is normal to have a small amount of spotting if your cervix was checked.   You don't feel your baby moving like normal.  If you don't, get you something to eat and drink and lay down and focus on feeling your baby move.   If your baby is still not moving like normal, you should call the office or go to [facility].  [place] Pediatricians/Family Doctors:  [facility] [phone]            [facility] [phone]                  [facility] [phone] (usually not accepting new patients unless you have family there already, you are always welcome to call and ask)            [facility] ([address] [place]) [phone]   [place] Pediatricians/Family Doctors:   [facility]: [phone]  [facility]: [phone]    Second Trimester of Pregnancy The second trimester is from week 13 through week 28, months 4 through 6. The second trimester is often a time when you feel your best. Your body has also adjusted to being pregnant, and you begin to feel better physically. Usually, morning sickness has lessened or quit completely, you may have more energy, and you may have an increase in appetite. The second trimester is also a time when the fetus is growing rapidly. At the end of the sixth month, the fetus is about 9 inches long and weighs about 1 pounds. You will likely begin to feel the baby move (quickening) between 18 and 20  weeks of the pregnancy. BODY CHANGES Your body goes through many changes during pregnancy. The changes vary from woman to woman.   Your weight will continue to increase. You will notice your lower abdomen bulging out.  You may begin to get stretch marks on your hips, abdomen, and breasts.  You may develop headaches that can be relieved by medicines approved by your health care provider.  You may urinate more often because the fetus is pressing on your bladder.  You may develop or continue to have heartburn as a result of your pregnancy.  You may develop constipation because certain hormones are causing the muscles that push waste through your intestines to slow down.  You may develop hemorrhoids or swollen, bulging veins (varicose veins).  You may have back pain because of the weight gain and pregnancy hormones relaxing your joints between the bones in your pelvis and as a result of a shift in weight and the muscles that support your balance.  Your breasts will continue to grow and be tender.  Your gums may bleed and may be sensitive to brushing and flossing.  Dark spots or blotches (chloasma, mask of pregnancy) may develop on your face. This will likely fade after the baby is born.  A dark line from your belly button to the pubic   area (linea nigra) may appear. This will likely fade after the baby is born.  You may have changes in your hair. These can include thickening of your hair, rapid growth, and changes in texture. Some women also have hair loss during or after pregnancy, or hair that feels dry or thin. Your hair will most likely return to normal after your baby is born. WHAT TO EXPECT AT YOUR PRENATAL VISITS During a routine prenatal visit:  You will be weighed to make sure you and the fetus are growing normally.  Your blood pressure will be taken.  Your abdomen will be measured to track your baby's growth.  The fetal heartbeat will be listened to.  Any test results  from the previous visit will be discussed. Your health care provider may ask you:  How you are feeling.  If you are feeling the baby move.  If you have had any abnormal symptoms, such as leaking fluid, bleeding, severe headaches, or abdominal cramping.  If you have any questions. Other tests that may be performed during your second trimester include:  Blood tests that check for:  Low iron levels (anemia).  Gestational diabetes (between 24 and 28 weeks).  Rh antibodies.  Urine tests to check for infections, diabetes, or protein in the urine.  An ultrasound to confirm the proper growth and development of the baby.  An amniocentesis to check for possible genetic problems.  Fetal screens for spina bifida and Down syndrome. HOME CARE INSTRUCTIONS   Avoid all smoking, herbs, alcohol, and unprescribed drugs. These chemicals affect the formation and growth of the baby.  Follow your health care provider's instructions regarding medicine use. There are medicines that are either safe or unsafe to take during pregnancy.  Exercise only as directed by your health care provider. Experiencing uterine cramps is a good sign to stop exercising.  Continue to eat regular, healthy meals.  Wear a good support bra for breast tenderness.  Do not use hot tubs, steam rooms, or saunas.  Wear your seat belt at all times when driving.  Avoid raw meat, uncooked cheese, cat litter boxes, and soil used by cats. These carry germs that can cause birth defects in the baby.  Take your prenatal vitamins.  Try taking a stool softener (if your health care provider approves) if you develop constipation. Eat more high-fiber foods, such as fresh vegetables or fruit and whole grains. Drink plenty of fluids to keep your urine clear or pale yellow.  Take warm sitz baths to soothe any pain or discomfort caused by hemorrhoids. Use hemorrhoid cream if your health care provider approves.  If you develop varicose  veins, wear support hose. Elevate your feet for 15 minutes, 3-4 times a day. Limit salt in your diet.  Avoid heavy lifting, wear low heel shoes, and practice good posture.  Rest with your legs elevated if you have leg cramps or low back pain.  Visit your dentist if you have not gone yet during your pregnancy. Use a soft toothbrush to brush your teeth and be gentle when you floss.  A sexual relationship may be continued unless your health care provider directs you otherwise.  Continue to go to all your prenatal visits as directed by your health care provider. SEEK MEDICAL CARE IF:   You have dizziness.  You have mild pelvic cramps, pelvic pressure, or nagging pain in the abdominal area.  You have persistent nausea, vomiting, or diarrhea.  You have a bad smelling vaginal discharge.  You have   pain with urination. SEEK IMMEDIATE MEDICAL CARE IF:   You have a fever.  You are leaking fluid from your vagina.  You have spotting or bleeding from your vagina.  You have severe abdominal cramping or pain.  You have rapid weight gain or loss.  You have shortness of breath with chest pain.  You notice sudden or extreme swelling of your face, hands, ankles, feet, or legs.  You have not felt your baby move in over an hour.  You have severe headaches that do not go away with medicine.  You have vision changes. Document Released: 04/15/2001 Document Revised: 04/26/2013 Document Reviewed: 06/22/2012 ExitCare Patient Information 2015 [employer]. This information is not intended to replace advice given to you by your health care provider. Make sure you discuss any questions you have with your health care provider.     

## 2016-04-30 NOTE — Progress Notes (Signed)
Low-risk OB appointment G3P0020 8430w4d Estimated Date of Delivery: 08/16/16 BP 122/62   Pulse 92   Wt 119 lb (54 kg)   LMP 10/20/2015 (Exact Date)   BMI 19.21 kg/m   BP, weight, and urine reviewed.  Refer to obstetrical flow sheet for FH & FHR.  Reports good fm.  Denies regular uc's, lof, vb, or uti s/s. No complaints. Has mod leuks, tr protein in urine- some hesitancy x 1mth- will send cx.  Reviewed ptl s/s, fm. Plan:  Continue routine obstetrical care  F/U in 4wks for OB appointment and pn2 Recommended flu shot w/ pcp/hd (<21yo)

## 2016-05-01 LAB — URINE CULTURE: ORGANISM ID, BACTERIA: NO GROWTH

## 2016-05-05 NOTE — L&D Delivery Note (Signed)
21 y/o now G3P1 with SROM and SOL.   Delivery Note At 2:51 PM a viable female was delivered via Vaginal, Spontaneous Delivery (Presentation: LOA ; vertex  ).  APGAR:8 ,9 ; weight  pending.   Placenta status: delivered intact with gentle traction.  Cord:3 vessel  with the following complications: nuchal x1 .  Cord pH: not collected  Anesthesia:  epidural Episiotomy: None Lacerations: Labial Suture Repair: 3.0 chromic Est. Blood Loss (mL): 100  Mom to postpartum.  Baby to Couplet care / Skin to Skin.  Joann Duncan 08/06/2016, 3:15 PM

## 2016-05-27 ENCOUNTER — Ambulatory Visit (INDEPENDENT_AMBULATORY_CARE_PROVIDER_SITE_OTHER): Payer: BLUE CROSS/BLUE SHIELD | Admitting: Women's Health

## 2016-05-27 ENCOUNTER — Other Ambulatory Visit: Payer: BLUE CROSS/BLUE SHIELD

## 2016-05-27 ENCOUNTER — Encounter: Payer: Self-pay | Admitting: Women's Health

## 2016-05-27 VITALS — BP 106/57 | HR 86 | Wt 121.0 lb

## 2016-05-27 DIAGNOSIS — Z3A28 28 weeks gestation of pregnancy: Secondary | ICD-10-CM

## 2016-05-27 DIAGNOSIS — Z331 Pregnant state, incidental: Secondary | ICD-10-CM

## 2016-05-27 DIAGNOSIS — Z3482 Encounter for supervision of other normal pregnancy, second trimester: Secondary | ICD-10-CM

## 2016-05-27 DIAGNOSIS — Z1389 Encounter for screening for other disorder: Secondary | ICD-10-CM

## 2016-05-27 DIAGNOSIS — Z131 Encounter for screening for diabetes mellitus: Secondary | ICD-10-CM

## 2016-05-27 DIAGNOSIS — Z3483 Encounter for supervision of other normal pregnancy, third trimester: Secondary | ICD-10-CM

## 2016-05-27 LAB — POCT URINALYSIS DIPSTICK
Blood, UA: NEGATIVE
Glucose, UA: NEGATIVE
Nitrite, UA: NEGATIVE

## 2016-05-27 NOTE — Progress Notes (Signed)
Low-risk OB appointment [id number] 8132w3d Estimated Date of Delivery: 08/16/16 BP (!) 106/57   Pulse 86   Wt 121 lb (54.9 kg)   LMP 10/20/2015 (Exact Date)   BMI 19.53 kg/m   BP, weight, and urine reviewed.  Refer to obstetrical flow sheet for FH & FHR.  Reports good fm.  Denies regular uc's, lof, vb, or uti s/s. No complaints. Reviewed ptl s/s, fkc. Recommended Tdap at HD/PCP per CDC guidelines.  Plan:  Continue routine obstetrical care  F/U in 4wks for OB appointment  PN2 today

## 2016-05-27 NOTE — Patient Instructions (Signed)

## 2016-05-28 LAB — CBC
HEMATOCRIT: 37.1 % (ref 34.0–46.6)
HEMOGLOBIN: 12.4 g/dL (ref 11.1–15.9)
MCH: 31.6 pg (ref 26.6–33.0)
MCHC: 33.4 g/dL (ref 31.5–35.7)
MCV: 95 fL (ref 79–97)
Platelets: 311 10*3/uL (ref 150–379)
RBC: 3.92 x10E6/uL (ref 3.77–5.28)
RDW: 12.9 % (ref 12.3–15.4)
WBC: 19.1 10*3/uL — ABNORMAL HIGH (ref 3.4–10.8)

## 2016-05-28 LAB — RPR: RPR: NONREACTIVE

## 2016-05-28 LAB — HIV ANTIBODY (ROUTINE TESTING W REFLEX): HIV SCREEN 4TH GENERATION: NONREACTIVE

## 2016-05-28 LAB — GLUCOSE TOLERANCE, 2 HOURS W/ 1HR
Glucose, 1 hour: 153 mg/dL (ref 65–179)
Glucose, 2 hour: 150 mg/dL (ref 65–152)
Glucose, Fasting: 71 mg/dL (ref 65–91)

## 2016-05-28 LAB — ANTIBODY SCREEN: Antibody Screen: NEGATIVE

## 2016-06-24 ENCOUNTER — Encounter: Payer: Self-pay | Admitting: Women's Health

## 2016-06-24 ENCOUNTER — Ambulatory Visit (INDEPENDENT_AMBULATORY_CARE_PROVIDER_SITE_OTHER): Payer: BLUE CROSS/BLUE SHIELD | Admitting: Women's Health

## 2016-06-24 VITALS — BP 118/60 | HR 82 | Wt 124.0 lb

## 2016-06-24 DIAGNOSIS — Z3A32 32 weeks gestation of pregnancy: Secondary | ICD-10-CM

## 2016-06-24 DIAGNOSIS — Z1389 Encounter for screening for other disorder: Secondary | ICD-10-CM

## 2016-06-24 DIAGNOSIS — Z3483 Encounter for supervision of other normal pregnancy, third trimester: Secondary | ICD-10-CM

## 2016-06-24 DIAGNOSIS — O26843 Uterine size-date discrepancy, third trimester: Secondary | ICD-10-CM

## 2016-06-24 DIAGNOSIS — Z331 Pregnant state, incidental: Secondary | ICD-10-CM

## 2016-06-24 DIAGNOSIS — D72829 Elevated white blood cell count, unspecified: Secondary | ICD-10-CM

## 2016-06-24 LAB — POCT URINALYSIS DIPSTICK
Blood, UA: NEGATIVE
Glucose, UA: NEGATIVE
KETONES UA: NEGATIVE
Nitrite, UA: NEGATIVE
PROTEIN UA: NEGATIVE

## 2016-06-24 NOTE — Patient Instructions (Signed)
Call the office ([phone]) or go to [facility] if:  You begin to have strong, frequent contractions  Your water breaks.  Sometimes it is a big gush of fluid, sometimes it is just a trickle that keeps getting your panties wet or running down your legs  You have vaginal bleeding.  It is normal to have a small amount of spotting if your cervix was checked.   You don't feel your baby moving like normal.  If you don't, get you something to eat and drink and lay down and focus on feeling your baby move.  You should feel at least 10 movements in 2 hours.  If you don't, you should call the office or go to [facility].     Preterm Labor and Birth Information The normal length of a pregnancy is 39-41 weeks. Preterm labor is when labor starts before 37 completed weeks of pregnancy. What are the risk factors for preterm labor? Preterm labor is more likely to occur in women who:  Have certain infections during pregnancy such as a bladder infection, sexually transmitted infection, or infection inside the uterus (chorioamnionitis).  Have a shorter-than-normal cervix.  Have gone into preterm labor before.  Have had surgery on their cervix.  Are younger than age 17 or older than age 35.  Are African American.  Are pregnant with twins or multiple babies (multiple gestation).  Take street drugs or smoke while pregnant.  Do not gain enough weight while pregnant.  Became pregnant shortly after having been pregnant. What are the symptoms of preterm labor? Symptoms of preterm labor include:  Cramps similar to those that can happen during a menstrual period. The cramps may happen with diarrhea.  Pain in the abdomen or lower back.  Regular uterine contractions that may feel like tightening of the abdomen.  A feeling of increased pressure in the pelvis.  Increased watery or bloody mucus discharge from the vagina.  Water breaking (ruptured amniotic sac). Why is it important to  recognize signs of preterm labor? It is important to recognize signs of preterm labor because babies who are born prematurely may not be fully developed. This can put them at an increased risk for:  Long-term (chronic) heart and lung problems.  Difficulty immediately after birth with regulating body systems, including blood sugar, body temperature, heart rate, and breathing rate.  Bleeding in the brain.  Cerebral palsy.  Learning difficulties.  Death. These risks are highest for babies who are born before 34 weeks of pregnancy. How is preterm labor treated? Treatment depends on the length of your pregnancy, your condition, and the health of your baby. It may involve:  Having a stitch (suture) placed in your cervix to prevent your cervix from opening too early (cerclage).  Taking or being given medicines, such as:  Hormone medicines. These may be given early in pregnancy to help support the pregnancy.  Medicine to stop contractions.  Medicines to help mature the baby's lungs. These may be prescribed if the risk of delivery is high.  Medicines to prevent your baby from developing cerebral palsy. If the labor happens before 34 weeks of pregnancy, you may need to stay in the hospital. What should I do if I think I am in preterm labor? If you think that you are going into preterm labor, call your health care provider right away. How can I prevent preterm labor in future pregnancies? To increase your chance of having a full-term pregnancy:  Do not use any tobacco products, such   as cigarettes, chewing tobacco, and e-cigarettes. If you need help quitting, ask your health care provider.  Do not use street drugs or medicines that have not been prescribed to you during your pregnancy.  Talk with your health care provider before taking any herbal supplements, even if you have been taking them regularly.  Make sure you gain a healthy amount of weight during your pregnancy.  Watch for  infection. If you think that you might have an infection, get it checked right away.  Make sure to tell your health care provider if you have gone into preterm labor before. This information is not intended to replace advice given to you by your health care provider. Make sure you discuss any questions you have with your health care provider. Document Released: 07/12/2003 Document Revised: 10/02/2015 Document Reviewed: 09/12/2015 Elsevier Interactive Patient Education  2017 [employer] Inc.  

## 2016-06-24 NOTE — Progress Notes (Signed)
Low-risk OB appointment G3P0020 243w3d Estimated Date of Delivery: 08/16/16 BP 118/60   Pulse 82   Wt 124 lb (56.2 kg)   LMP 10/20/2015 (Exact Date)   BMI 20.01 kg/m   BP, weight, and urine reviewed.  Refer to obstetrical flow sheet for FH & FHR.  Reports good fm.  Denies regular uc's, lof, vb, or uti s/s. No complaints. Reviewed ptl s/s, fkc, pn2 results including wbc of 19- will recheck today. Plan:  Continue routine obstetrical care  F/U asap for efw/afi u/s for s<d (no visit), then 2wks for OB appointment

## 2016-06-25 LAB — CBC
HEMOGLOBIN: 11.9 g/dL (ref 11.1–15.9)
Hematocrit: 36.2 % (ref 34.0–46.6)
MCH: 30.9 pg (ref 26.6–33.0)
MCHC: 32.9 g/dL (ref 31.5–35.7)
MCV: 94 fL (ref 79–97)
Platelets: 320 10*3/uL (ref 150–379)
RBC: 3.85 x10E6/uL (ref 3.77–5.28)
RDW: 12.9 % (ref 12.3–15.4)
WBC: 17 10*3/uL — ABNORMAL HIGH (ref 3.4–10.8)

## 2016-06-27 ENCOUNTER — Ambulatory Visit (INDEPENDENT_AMBULATORY_CARE_PROVIDER_SITE_OTHER): Payer: BLUE CROSS/BLUE SHIELD

## 2016-06-27 DIAGNOSIS — Z3403 Encounter for supervision of normal first pregnancy, third trimester: Secondary | ICD-10-CM

## 2016-06-27 DIAGNOSIS — Z3A33 33 weeks gestation of pregnancy: Secondary | ICD-10-CM | POA: Diagnosis not present

## 2016-06-27 DIAGNOSIS — O26843 Uterine size-date discrepancy, third trimester: Secondary | ICD-10-CM | POA: Diagnosis not present

## 2016-06-27 NOTE — Progress Notes (Signed)
US 32+6 wks,cephalic,ant pl gr 1,normal ov's bilat,afi 10.5 cm,fhr 147 bpm,EFW 1984 g 39%,limited head measurement because of fetal position

## 2016-07-08 ENCOUNTER — Ambulatory Visit (INDEPENDENT_AMBULATORY_CARE_PROVIDER_SITE_OTHER): Payer: BLUE CROSS/BLUE SHIELD | Admitting: Obstetrics & Gynecology

## 2016-07-08 ENCOUNTER — Encounter: Payer: Self-pay | Admitting: Obstetrics & Gynecology

## 2016-07-08 VITALS — BP 100/60 | HR 84 | Wt 128.0 lb

## 2016-07-08 DIAGNOSIS — O26843 Uterine size-date discrepancy, third trimester: Secondary | ICD-10-CM

## 2016-07-08 DIAGNOSIS — Z331 Pregnant state, incidental: Secondary | ICD-10-CM

## 2016-07-08 DIAGNOSIS — Z3A34 34 weeks gestation of pregnancy: Secondary | ICD-10-CM

## 2016-07-08 DIAGNOSIS — Z3483 Encounter for supervision of other normal pregnancy, third trimester: Secondary | ICD-10-CM

## 2016-07-08 DIAGNOSIS — Z1389 Encounter for screening for other disorder: Secondary | ICD-10-CM

## 2016-07-08 LAB — POCT URINALYSIS DIPSTICK
GLUCOSE UA: NEGATIVE
Ketones, UA: NEGATIVE
LEUKOCYTES UA: NEGATIVE
NITRITE UA: NEGATIVE
Protein, UA: NEGATIVE
RBC UA: NEGATIVE

## 2016-07-08 NOTE — Progress Notes (Signed)
[id number] 866w3d Estimated Date of Delivery: 08/16/16  Blood pressure 100/60, pulse 84, weight 128 lb (58.1 kg), last menstrual period 10/20/2015.   BP weight and urine results all reviewed and noted.  Please refer to the obstetrical flow sheet for the fundal height and fetal heart rate documentation:  Patient reports good fetal movement, denies any bleeding and no rupture of membranes symptoms or regular contractions. Patient is without complaints. All questions were answered.  Orders Placed This Encounter  Procedures  . US OB Follow Up  . POCT urinalysis dipstick    Plan:  Continued routine obstetrical care, FH still lagging sonogram is reviewed Will repeat in 2 weeks to ensure appropriate growth trajectory  Return in about 2 weeks (around 07/22/2016) for sonogram for EFW, , LROB.

## 2016-07-11 DIAGNOSIS — Z029 Encounter for administrative examinations, unspecified: Secondary | ICD-10-CM

## 2016-07-22 ENCOUNTER — Ambulatory Visit (INDEPENDENT_AMBULATORY_CARE_PROVIDER_SITE_OTHER): Payer: BLUE CROSS/BLUE SHIELD

## 2016-07-22 ENCOUNTER — Ambulatory Visit (INDEPENDENT_AMBULATORY_CARE_PROVIDER_SITE_OTHER): Payer: BLUE CROSS/BLUE SHIELD | Admitting: Obstetrics & Gynecology

## 2016-07-22 ENCOUNTER — Encounter: Payer: Self-pay | Admitting: Obstetrics & Gynecology

## 2016-07-22 VITALS — BP 112/62 | HR 82 | Wt 129.0 lb

## 2016-07-22 DIAGNOSIS — Z3403 Encounter for supervision of normal first pregnancy, third trimester: Secondary | ICD-10-CM

## 2016-07-22 DIAGNOSIS — O365990 Maternal care for other known or suspected poor fetal growth, unspecified trimester, not applicable or unspecified: Secondary | ICD-10-CM

## 2016-07-22 DIAGNOSIS — Z1389 Encounter for screening for other disorder: Secondary | ICD-10-CM

## 2016-07-22 DIAGNOSIS — Z331 Pregnant state, incidental: Secondary | ICD-10-CM

## 2016-07-22 LAB — POCT URINALYSIS DIPSTICK
Blood, UA: NEGATIVE
Glucose, UA: NEGATIVE
KETONES UA: NEGATIVE
Nitrite, UA: NEGATIVE
PROTEIN UA: NEGATIVE

## 2016-07-22 NOTE — Progress Notes (Signed)
[id number] 247w3d Estimated Date of Delivery: 08/16/16  Blood pressure 112/62, pulse 82, weight 129 lb (58.5 kg), last menstrual period 10/20/2015.   BP weight and urine results all reviewed and noted.  Please refer to the obstetrical flow sheet for the fundal height and fetal heart rate documentation:  Patient reports good fetal movement, denies any bleeding and no rupture of membranes symptoms or regular contractions. Patient is without complaints. All questions were answered.  Orders Placed This Encounter  Procedures  . POCT urinalysis dipstick    Plan:  Continued routine obstetrical care, sonogram EFW 25% symmetrical with normal fluid  Return in about 1 week (around 07/29/2016) for LROB.

## 2016-07-22 NOTE — Progress Notes (Signed)
US 36+3 wks,cephalic,ant pl gr 2,afi 11.9 cm,fhr 140 bpm,EFW 2623 g 25% ,limited head measurement because of fetal position

## 2016-07-31 ENCOUNTER — Encounter: Payer: Self-pay | Admitting: Advanced Practice Midwife

## 2016-07-31 ENCOUNTER — Ambulatory Visit (INDEPENDENT_AMBULATORY_CARE_PROVIDER_SITE_OTHER): Payer: BLUE CROSS/BLUE SHIELD | Admitting: Advanced Practice Midwife

## 2016-07-31 VITALS — BP 112/68 | HR 80 | Wt 132.0 lb

## 2016-07-31 DIAGNOSIS — Z331 Pregnant state, incidental: Secondary | ICD-10-CM

## 2016-07-31 DIAGNOSIS — Z1389 Encounter for screening for other disorder: Secondary | ICD-10-CM

## 2016-07-31 DIAGNOSIS — O1213 Gestational proteinuria, third trimester: Secondary | ICD-10-CM

## 2016-07-31 DIAGNOSIS — Z3A37 37 weeks gestation of pregnancy: Secondary | ICD-10-CM

## 2016-07-31 DIAGNOSIS — Z3483 Encounter for supervision of other normal pregnancy, third trimester: Secondary | ICD-10-CM

## 2016-07-31 LAB — POCT URINALYSIS DIPSTICK
Blood, UA: NEGATIVE
GLUCOSE UA: NEGATIVE
Ketones, UA: NEGATIVE
Nitrite, UA: NEGATIVE

## 2016-07-31 NOTE — Progress Notes (Signed)
[id number] 7944w5d Estimated Date of Delivery: 08/16/16  Blood pressure 112/68, pulse 80, weight 132 lb (59.9 kg), last menstrual period 10/20/2015.   BP weight and urine results all reviewed and noted. Denies UTI sx  Please refer to the obstetrical flow sheet for the fundal height and fetal heart rate documentation:  Patient reports good fetal movement, denies any bleeding and no rupture of membranes symptoms or regular contractions. Patient is without complaints. All questions were answered.  Orders Placed This Encounter  Procedures  . GC/Chlamydia Probe Amp  . Strep Gp B NAA  . Urine culture  . POCT urinalysis dipstick    Plan:  Continued routine obstetrical care,   Return in about 1 week (around 08/07/2016) for LROB.

## 2016-08-02 LAB — URINE CULTURE: ORGANISM ID, BACTERIA: NO GROWTH

## 2016-08-02 LAB — STREP GP B NAA: Strep Gp B NAA: NEGATIVE

## 2016-08-02 LAB — GC/CHLAMYDIA PROBE AMP
Chlamydia trachomatis, NAA: NEGATIVE
Neisseria gonorrhoeae by PCR: NEGATIVE

## 2016-08-06 ENCOUNTER — Inpatient Hospital Stay (HOSPITAL_COMMUNITY): Payer: BLUE CROSS/BLUE SHIELD | Admitting: Anesthesiology

## 2016-08-06 ENCOUNTER — Encounter (HOSPITAL_COMMUNITY): Payer: Self-pay | Admitting: *Deleted

## 2016-08-06 ENCOUNTER — Inpatient Hospital Stay (HOSPITAL_COMMUNITY)
Admission: AD | Admit: 2016-08-06 | Discharge: 2016-08-08 | DRG: 775 | Disposition: A | Discharge status: Home / Self Care | Payer: BLUE CROSS/BLUE SHIELD | Source: Ambulatory Visit | Attending: Obstetrics and Gynecology | Admitting: Obstetrics and Gynecology

## 2016-08-06 DIAGNOSIS — O9989 Other specified diseases and conditions complicating pregnancy, childbirth and the puerperium: Secondary | ICD-10-CM

## 2016-08-06 DIAGNOSIS — F17210 Nicotine dependence, cigarettes, uncomplicated: Secondary | ICD-10-CM | POA: Diagnosis present

## 2016-08-06 DIAGNOSIS — O4292 Full-term premature rupture of membranes, unspecified as to length of time between rupture and onset of labor: Secondary | ICD-10-CM | POA: Diagnosis present

## 2016-08-06 DIAGNOSIS — O4290 Premature rupture of membranes, unspecified as to length of time between rupture and onset of labor, unspecified weeks of gestation: Secondary | ICD-10-CM | POA: Diagnosis present

## 2016-08-06 DIAGNOSIS — O09899 Supervision of other high risk pregnancies, unspecified trimester: Secondary | ICD-10-CM

## 2016-08-06 DIAGNOSIS — O99334 Smoking (tobacco) complicating childbirth: Secondary | ICD-10-CM | POA: Diagnosis present

## 2016-08-06 DIAGNOSIS — Z3A38 38 weeks gestation of pregnancy: Secondary | ICD-10-CM | POA: Diagnosis not present

## 2016-08-06 DIAGNOSIS — Z3403 Encounter for supervision of normal first pregnancy, third trimester: Secondary | ICD-10-CM

## 2016-08-06 DIAGNOSIS — Z3493 Encounter for supervision of normal pregnancy, unspecified, third trimester: Secondary | ICD-10-CM | POA: Diagnosis present

## 2016-08-06 DIAGNOSIS — Z283 Underimmunization status: Secondary | ICD-10-CM

## 2016-08-06 LAB — CBC
HCT: 36.9 % (ref 36.0–46.0)
Hemoglobin: 12.5 g/dL (ref 12.0–15.0)
MCH: 31 pg (ref 26.0–34.0)
MCHC: 33.9 g/dL (ref 30.0–36.0)
MCV: 91.6 fL (ref 78.0–100.0)
Platelets: 323 10*3/uL (ref 150–400)
RBC: 4.03 MIL/uL (ref 3.87–5.11)
RDW: 13.2 % (ref 11.5–15.5)
WBC: 19.3 10*3/uL — ABNORMAL HIGH (ref 4.0–10.5)

## 2016-08-06 LAB — ABO/RH: ABO/RH(D): O POS

## 2016-08-06 LAB — TYPE AND SCREEN
ABO/RH(D): O POS
Antibody Screen: NEGATIVE

## 2016-08-06 MED ORDER — OXYTOCIN 40 UNITS IN LACTATED RINGERS INFUSION - SIMPLE MED
2.5000 [IU]/h | INTRAVENOUS | Status: DC
  Filled 2016-08-06: qty 1000

## 2016-08-06 MED ORDER — PHENYLEPHRINE 40 MCG/ML (10ML) SYRINGE FOR IV PUSH (FOR BLOOD PRESSURE SUPPORT)
80.0000 ug | PREFILLED_SYRINGE | INTRAVENOUS | Status: DC | PRN
Start: 2016-08-06 — End: 2016-08-06
  Filled 2016-08-06: qty 10
  Filled 2016-08-06: qty 5

## 2016-08-06 MED ORDER — FENTANYL 2.5 MCG/ML BUPIVACAINE 1/10 % EPIDURAL INFUSION (WH - ANES)
14.0000 mL/h | INTRAMUSCULAR | Status: DC | PRN
  Administered 2016-08-06 (×2): 14 mL/h via EPIDURAL
  Filled 2016-08-06: qty 100

## 2016-08-06 MED ORDER — DIBUCAINE 1 % RE OINT: 1.0000 "application " | TOPICAL_OINTMENT | RECTAL | Status: DC | PRN

## 2016-08-06 MED ORDER — IBUPROFEN 600 MG PO TABS
600.0000 mg | ORAL_TABLET | Freq: Four times a day (QID) | ORAL | Status: DC
  Administered 2016-08-06 – 2016-08-08 (×8): 600 mg via ORAL
  Filled 2016-08-06 (×8): qty 1

## 2016-08-06 MED ORDER — LIDOCAINE HCL (PF) 1 % IJ SOLN
30.0000 mL | INTRAMUSCULAR | Status: DC | PRN
  Filled 2016-08-06: qty 30

## 2016-08-06 MED ORDER — TETANUS-DIPHTH-ACELL PERTUSSIS 5-2.5-18.5 LF-MCG/0.5 IM SUSP: 0.5000 mL | Freq: Once | INTRAMUSCULAR | Status: DC

## 2016-08-06 MED ORDER — SOD CITRATE-CITRIC ACID 500-334 MG/5ML PO SOLN: 30.0000 mL | ORAL | Status: DC | PRN

## 2016-08-06 MED ORDER — ONDANSETRON HCL 4 MG PO TABS: 4.0000 mg | ORAL_TABLET | ORAL | Status: DC | PRN

## 2016-08-06 MED ORDER — ONDANSETRON HCL 4 MG/2ML IJ SOLN: 4.0000 mg | INTRAMUSCULAR | Status: DC | PRN

## 2016-08-06 MED ORDER — OXYCODONE-ACETAMINOPHEN 5-325 MG PO TABS: 1.0000 | ORAL_TABLET | ORAL | Status: DC | PRN

## 2016-08-06 MED ORDER — DIPHENHYDRAMINE HCL 50 MG/ML IJ SOLN: 12.5000 mg | INTRAMUSCULAR | Status: DC | PRN

## 2016-08-06 MED ORDER — COCONUT OIL OIL: 1.0000 "application " | TOPICAL_OIL | Status: DC | PRN

## 2016-08-06 MED ORDER — SIMETHICONE 80 MG PO CHEW: 80.0000 mg | CHEWABLE_TABLET | ORAL | Status: DC | PRN

## 2016-08-06 MED ORDER — PRENATAL MULTIVITAMIN CH
1.0000 | ORAL_TABLET | Freq: Every day | ORAL | Status: DC
  Administered 2016-08-07 – 2016-08-08 (×2): 1 via ORAL
  Filled 2016-08-06 (×2): qty 1

## 2016-08-06 MED ORDER — SENNOSIDES-DOCUSATE SODIUM 8.6-50 MG PO TABS
2.0000 | ORAL_TABLET | ORAL | Status: DC
  Administered 2016-08-06 – 2016-08-07 (×2): 2 via ORAL
  Filled 2016-08-06 (×2): qty 2

## 2016-08-06 MED ORDER — BENZOCAINE-MENTHOL 20-0.5 % EX AERO: 1.0000 "application " | INHALATION_SPRAY | CUTANEOUS | Status: DC | PRN

## 2016-08-06 MED ORDER — LACTATED RINGERS IV SOLN: 500.0000 mL | INTRAVENOUS | Status: DC | PRN

## 2016-08-06 MED ORDER — EPHEDRINE 5 MG/ML INJ
10.0000 mg | INTRAVENOUS | Status: DC | PRN
  Filled 2016-08-06: qty 2

## 2016-08-06 MED ORDER — LACTATED RINGERS IV SOLN
INTRAVENOUS | Status: DC
  Administered 2016-08-06: 11:00:00 via INTRAVENOUS
  Administered 2016-08-06: 125 mL via INTRAVENOUS

## 2016-08-06 MED ORDER — ACETAMINOPHEN 325 MG PO TABS: 650.0000 mg | ORAL_TABLET | ORAL | Status: DC | PRN

## 2016-08-06 MED ORDER — PHENYLEPHRINE 40 MCG/ML (10ML) SYRINGE FOR IV PUSH (FOR BLOOD PRESSURE SUPPORT)
80.0000 ug | PREFILLED_SYRINGE | INTRAVENOUS | Status: DC | PRN
  Filled 2016-08-06: qty 5

## 2016-08-06 MED ORDER — WITCH HAZEL-GLYCERIN EX PADS: 1.0000 "application " | MEDICATED_PAD | CUTANEOUS | Status: DC | PRN

## 2016-08-06 MED ORDER — OXYCODONE-ACETAMINOPHEN 5-325 MG PO TABS: 2.0000 | ORAL_TABLET | ORAL | Status: DC | PRN

## 2016-08-06 MED ORDER — ONDANSETRON HCL 4 MG/2ML IJ SOLN: 4.0000 mg | Freq: Four times a day (QID) | INTRAMUSCULAR | Status: DC | PRN

## 2016-08-06 MED ORDER — ZOLPIDEM TARTRATE 5 MG PO TABS: 5.0000 mg | ORAL_TABLET | Freq: Every evening | ORAL | Status: DC | PRN

## 2016-08-06 MED ORDER — LACTATED RINGERS IV SOLN
500.0000 mL | Freq: Once | INTRAVENOUS | Status: AC
  Administered 2016-08-06: 500 mL via INTRAVENOUS

## 2016-08-06 MED ORDER — LIDOCAINE HCL (PF) 1 % IJ SOLN
INTRAMUSCULAR | Status: DC | PRN
  Administered 2016-08-06 (×2): 6 mL via EPIDURAL

## 2016-08-06 MED ORDER — OXYTOCIN BOLUS FROM INFUSION
500.0000 mL | Freq: Once | INTRAVENOUS | Status: AC
Start: 2016-08-06 — End: 2016-08-06
  Administered 2016-08-06: 500 mL via INTRAVENOUS

## 2016-08-06 MED ORDER — FLEET ENEMA 7-19 GM/118ML RE ENEM: 1.0000 | ENEMA | RECTAL | Status: DC | PRN

## 2016-08-06 MED ORDER — FENTANYL CITRATE (PF) 100 MCG/2ML IJ SOLN: 50.0000 ug | INTRAMUSCULAR | Status: DC | PRN

## 2016-08-06 MED ORDER — DIPHENHYDRAMINE HCL 25 MG PO CAPS: 25.0000 mg | ORAL_CAPSULE | Freq: Four times a day (QID) | ORAL | Status: DC | PRN

## 2016-08-06 NOTE — Anesthesia Preprocedure Evaluation (Signed)
Anesthesia Evaluation  Patient identified by MRN, date of birth, ID band Patient awake    Reviewed: Allergy & Precautions, H&P , NPO status , Patient's Chart, lab work & pertinent test results  Airway Mallampati: I  TM Distance: >3 FB Neck ROM: full    Dental no notable dental hx.    Pulmonary neg pulmonary ROS, Current Smoker,    Pulmonary exam normal        Cardiovascular negative cardio ROS Normal cardiovascular exam     Neuro/Psych negative neurological ROS  negative psych ROS   GI/Hepatic negative GI ROS, Neg liver ROS,   Endo/Other  negative endocrine ROS  Renal/GU negative Renal ROS  negative genitourinary   Musculoskeletal negative musculoskeletal ROS (+)   Abdominal Normal abdominal exam  (+)   Peds negative pediatric ROS (+)  Hematology negative hematology ROS (+)   Anesthesia Other Findings   Reproductive/Obstetrics (+) Pregnancy                             Anesthesia Physical Anesthesia Plan  ASA: II  Anesthesia Plan:    Post-op Pain Management:    Induction:   Airway Management Planned:   Additional Equipment:   Intra-op Plan:   Post-operative Plan:   Informed Consent: I have reviewed the patients History and Physical, chart, labs and discussed the procedure including the risks, benefits and alternatives for the proposed anesthesia with the patient or authorized representative who has indicated his/her understanding and acceptance.     Plan Discussed with:   Anesthesia Plan Comments:         Anesthesia Quick Evaluation

## 2016-08-06 NOTE — Anesthesia Procedure Notes (Signed)
Epidural Patient location during procedure: OB Start time: 08/06/2016 8:51 AM End time: 08/06/2016 8:54 AM  Staffing Anesthesiologist: Leilani Able Performed: anesthesiologist   Preanesthetic Checklist Completed: patient identified, surgical consent, pre-op evaluation, timeout performed, IV checked, risks and benefits discussed and monitors and equipment checked  Epidural Patient position: sitting Prep: site prepped and draped and DuraPrep Patient monitoring: continuous pulse ox and blood pressure Approach: midline Location: L3-L4 Injection technique: LOR air  Needle:  Needle type: Tuohy  Needle gauge: 17 G Needle length: 9 cm and 9 Needle insertion depth: 5 cm cm Catheter type: closed end flexible Catheter size: 19 Gauge Catheter at skin depth: 10 cm Test dose: negative and Other  Assessment Sensory level: T9 Events: blood not aspirated, injection not painful, no injection resistance, negative IV test and no paresthesia  Additional Notes Reason for block:procedure for pain

## 2016-08-06 NOTE — Anesthesia Pain Management Evaluation Note (Signed)
  CRNA Pain Management Visit Note  Patient: Joann Duncan, 21 y.o., female  "Hello I am a member of the anesthesia team at [facility]. We have an anesthesia team available at all times to provide care throughout the hospital, including epidural management and anesthesia for C-section. I don't know your plan for the delivery whether it a natural birth, water birth, IV sedation, nitrous supplementation, doula or epidural, but we want to meet your pain goals."   1.Was your pain managed to your expectations on prior hospitalizations?   No prior hospitalizations  2.What is your expectation for pain management during this hospitalization?     Epidural  3.How can we help you reach that goal? unsure  Record the patient's initial score and the patient's pain goal.   Pain: 0  Pain Goal: 7 [facility] wants you to be able to say your pain was always managed very well.  Cephus Shelling 08/06/2016

## 2016-08-06 NOTE — Lactation Note (Addendum)
Lactation Consultation Note  Patient Name: Joann Duncan [id number] Date: 08/06/2016 Reason for consult: Initial assessment   Initial consult with first time mom in [facility]. Mom reports "a little" breast changes with pregnancy. Mom reports she is unsure how long she plans to BF.  Mom with small compressible breasts with small areola and nipples, colostrum easily expressible. Infant was on and off the breast and fussy at times. When she does latch she is noted to have flanged lips, rhythmic suckles and intermittent swallows. Enc mom to massage/compress breast with feeding. Enc mom to feed infant STS 8-12 x in 24 hours at first feeding cues.   BF Basics, pillow and head support, cluster feeding, milk coming to volume, hand expression, I/O, and cluster feeding. Feeding log given with instructions for use. BF Resources hand out and [facility] Brochure given, mom informed of IP/OP Services, BF Support Groups and LC phone #. Enc mom to call out for feeding assistance as needed. Mom is a [facility] client and is aware to call and make an appointment after d/c. Mom reports she has a Lansinoh pump at home.   GF came into room when mom was BF and told mom she should just give the infant a bottle. Mom and FOB did not address GF.    Maternal Data Formula Feeding for Exclusion: No Has patient been taught Hand Expression?: Yes Does the patient have breastfeeding experience prior to this delivery?: No  Feeding Feeding Type: Breast Fed Length of feed: 5 min  LATCH Score/Interventions Latch: Repeated attempts needed to sustain latch, nipple held in mouth throughout feeding, stimulation needed to elicit sucking reflex. Intervention(s): Adjust position;Assist with latch;Breast massage;Breast compression  Audible Swallowing: A few with stimulation Intervention(s): Hand expression;Skin to skin;Alternate breast massage  Type of Nipple: Everted at rest and after stimulation  Comfort (Breast/Nipple): Soft /  non-tender     Hold (Positioning): Assistance needed to correctly position infant at breast and maintain latch. Intervention(s): Breastfeeding basics reviewed;Support Pillows;Position options;Skin to skin  LATCH Score: 7  Lactation Tools Discussed/Used WIC Program: Yes   Consult Status Consult Status: Follow-up Date: 08/07/16 Follow-up type: In-patient    Silas Flood Orla Jolliff 08/06/2016, 4:13 PM

## 2016-08-06 NOTE — MAU Note (Signed)
PT SAYS HURT BAD WITH UC   SINCE 0600.   VE  2 CM LAST WEEK .  PNC  WITH  [facility].    DENIES HSV .    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      GBS- NEG

## 2016-08-06 NOTE — Lactation Note (Signed)
This note was copied from a baby's chart. Lactation Consultation Note Baby at 7 hr of life. Upon entry family was sleeping. Left handouts on the bedside table.   Patient Name: Joann Duncan Date: 08/06/2016  [name] 08/06/2016, 10:43 PM

## 2016-08-06 NOTE — H&P (Signed)
LABOR AND DELIVERY ADMISSION HISTORY AND PHYSICAL NOTE  Joann Duncan is a 21 y.o. female G54P0020 with IUP at [redacted]w[redacted]d by ultrasound presenting for contractions that began at approximately 5:30 am today.  Contractions were stated to become more intense with a frequency of 4 minutes apart following SROM at 5:15 am today.   She reports positive fetal movement and leakage of fluid and Denies vaginal bleeding.  Prenatal History/Complications:  Past Medical History: Past Medical History:  Diagnosis Date  . Medical history non-contributory   . Pregnant 12/18/2015    Past Surgical History: Past Surgical History:  Procedure Laterality Date  . NO PAST SURGERIES      Obstetrical History: OB History    Gravida Para Term Preterm AB Living   3       2     SAB TAB Ectopic Multiple Live Births   2              Social History: Social History   Social History  . Marital status: Married    Spouse name: N/A  . Number of children: N/A  . Years of education: N/A   Social History Main Topics  . Smoking status: Current Some Day Smoker    Packs/day: 0.25    Years: 5.00    Types: Cigarettes  . Smokeless tobacco: Never Used  . Alcohol use No  . Drug use: No  . Sexual activity: Yes    Birth control/ protection: None   Other Topics Concern  . None   Social History Narrative  . None    Family History: Family History  Problem Relation Age of Onset  . Cancer Paternal Grandfather     Allergies: No Known Allergies  Prescriptions Prior to Admission  Medication Sig Dispense Refill Last Dose  . prenatal vitamin w/FE, FA (PRENATAL 1 + 1) 27-1 MG TABS tablet Take 1 tablet by mouth daily at 12 noon. 30 each 12 08/05/2016 at Unknown time     Review of Systems   All systems reviewed and negative except as stated in HPI  Blood pressure 132/76, pulse 87, temperature 97.6 F (36.4 C), temperature source Oral, resp. rate 18, height  (1.676 m), weight 132 lb (59.9 kg), last menstrual  period 10/20/2015. General appearance: alert, cooperative and appears stated age Lungs: Normal effort no audible wheezing. Heart: regular rate and pulse palpated distally  Abdomen: soft, non-tender Extremities: No calf swelling or tenderness Presentation: cephalic by nursing exam Fetal monitoring: FHR 120, no decel. Uterine activity: irregular Dilation: 4 Effacement (%): 90 Station: -2 Exam by:: Ivonne Andrew, CNM   Prenatal labs: ABO, Rh: O/Positive/-- (08/22 0933) Antibody: Negative (01/23 0852) Rubella: non-immune RPR: Non Reactive (01/23 0852)  HBsAg: Negative (08/22 0933)  HIV: Non Reactive (01/23 0852)  GBS: Negative (03/29 1630)  1 hr Glucola: 2 hr. Normal: 71/153/150 Genetic screening:  NT/IT: Neg Anatomy US: Normal Female, "Adalyn"  Prenatal Transfer Tool  Maternal Diabetes: No Genetic Screening: Normal Maternal Ultrasounds/Referrals: Normal Fetal Ultrasounds or other Referrals:  None Maternal Substance Abuse:  Yes:  Type: Smoker Significant Maternal Medications:  None Significant Maternal Lab Results: Lab values include: Group B Strep negative  Results for orders placed or performed during the hospital encounter of 08/06/16 (from the past 24 hour(s))  CBC   Collection Time: 08/06/16  8:00 AM  Result Value Ref Range   WBC 19.3 (H) 4.0 - 10.5 K/uL   RBC 4.03 3.87 - 5.11 MIL/uL   Hemoglobin 12.5 12.0 - 15.0  g/dL   HCT 63.0 16.0 - 10.9 %   MCV 91.6 78.0 - 100.0 fL   MCH 31.0 26.0 - 34.0 pg   MCHC 33.9 30.0 - 36.0 g/dL   RDW 32.3 55.7 - 32.2 %   Platelets 323 150 - 400 K/uL    Patient Active Problem List   Diagnosis Date Noted  . Leakage of amniotic fluid 08/06/2016  . Rubella non-immune status, antepartum 12/26/2015  . Supervision of normal pregnancy 12/25/2015  . Smoker 12/25/2015    Assessment: Joann Duncan is a 21 y.o. G3P0020 at [redacted]w[redacted]d here for SROM with contractions.  #Labor: Expectant management  #Pain: IV pain meds and epidural  #FWB: Category  1  #ID:  GBS neg #MOF: Breast #MOC: Patient wants OCP, patient desires counsel about options following delivery. #Circ:  NA  Abdoulaye Diallo 08/06/2016, 9:11 AM  OB FELLOW HISTORY AND PHYSICAL ATTESTATION  I confirm that I have verified the information documented in the resident's note and that I have also personally reperformed the physical exam and all medical decision making activities.  Ernestina Penna 08/06/2016, 10:57 AM

## 2016-08-07 LAB — RPR: RPR: NONREACTIVE

## 2016-08-07 NOTE — Progress Notes (Signed)
Post Partum Day 1 Subjective:  Mazelle Huebert is a 21 y.o. [id number] 47w4ds/p SVD.  No acute events overnight.  Pt denies problems with ambulating, voiding or po intake.  She denies nausea or vomiting.  Pain is well controlled. Lochia Minimal.  Plan for birth control is oral progesterone-only contraceptive.  Method of Feeding: breast  Objective: Blood pressure (!) 111/59, pulse 75, temperature 98.4 F (36.9 C), temperature source Oral, resp. rate 18, height 5' 6"  (1.676 m), weight 132 lb (59.9 kg), last menstrual period 10/20/2015, unknown if currently breastfeeding.  Physical Exam:  General: alert, cooperative and no distress Lochia:normal flow Chest: normal WOB Heart: Regular rate Abdomen: +BS, soft, mild TTP (appropriate) Uterine Fundus: firm DVT Evaluation: No evidence of DVT seen on physical exam. Extremities: no edema   Recent Labs  08/06/16 0800  HGB 12.5  HCT 36.9    Assessment/Plan:  ASSESSMENT: MTeara Duerksenis a 21y.o. [id number]/p SVD. Rubella nonimmune.  Plan for discharge tomorrow Continue routine PP care MMR today Breastfeeding support PRN  LOS: 1 day   TaMercy Riding/09/2016, 6:57 AM   I confirm that I have verified the information documented in the resident's note and that I have also personally performed the physical exam and all medical decision making activities.  KaMaye HidesNm

## 2016-08-07 NOTE — Lactation Note (Signed)
This note was copied from a baby's chart. Lactation Consultation Note  Patient Name: Joann Duncan ZOXWR'U Date: 08/07/2016 Reason for consult: Follow-up assessment  Baby 27 hours old on phototherapy and sleepy at the breast. Assisted mom to latch baby to breast in cross-cradle position and enc mom to support baby's head. Baby had several bursts of suckling, but would not continue nursing even with stimulation. Assisted parents to hand express and spoon feed baby 3 ml of EBM and baby tolerated well. Parents given supplementation guidelines and enc to latch baby with cues and at least every 3 hours. Enc supplementing with EBM by spoon. Mom given a manual pump with review. Discussed benefits of EBM to binding bilirubin.   Discussed assessment and interventions with patient's bedside nurse, Shanda Bumps, RN.  Maternal Data Has patient been taught Hand Expression?: Yes Does the patient have breastfeeding experience prior to this delivery?: No  Feeding Feeding Type: Breast Fed Length of feed: 0 min  LATCH Score/Interventions Latch: Too sleepy or reluctant, no latch achieved, no sucking elicited. Intervention(s): Skin to skin;Waking techniques  Audible Swallowing: None Intervention(s): Skin to skin;Hand expression  Type of Nipple: Everted at rest and after stimulation  Comfort (Breast/Nipple): Soft / non-tender     Hold (Positioning): Assistance needed to correctly position infant at breast and maintain latch. Intervention(s): Breastfeeding basics reviewed;Support Pillows;Position options;Skin to skin  LATCH Score: 5  Lactation Tools Discussed/Used     Consult Status Consult Status: Follow-up Date: 08/08/16 Follow-up type: In-patient    Joann Duncan 08/07/2016, 6:25 PM

## 2016-08-07 NOTE — Anesthesia Postprocedure Evaluation (Signed)
Anesthesia Post Note  Patient: Joann Duncan  Procedure(s) Performed: * No procedures listed *  Patient location during evaluation: Mother Baby Anesthesia Type: Epidural Level of consciousness: awake, awake and alert, oriented and patient cooperative Pain management: pain level controlled Vital Signs Assessment: post-procedure vital signs reviewed and stable Respiratory status: spontaneous breathing, nonlabored ventilation and respiratory function stable Cardiovascular status: stable Postop Assessment: no headache, no backache, patient able to bend at knees and no signs of nausea or vomiting Anesthetic complications: no        Last Vitals:  Vitals:   08/06/16 2201 08/07/16 0549  BP: 119/76 (!) 111/59  Pulse: 81 75  Resp: 16 18  Temp: 36.6 C 36.9 C    Last Pain:  Vitals:   08/07/16 0551  TempSrc:   PainSc: 0-No pain   Pain Goal:                 Jennamarie Goings L

## 2016-08-08 ENCOUNTER — Encounter: Payer: BLUE CROSS/BLUE SHIELD | Admitting: Women's Health

## 2016-08-08 MED ORDER — MEASLES, MUMPS & RUBELLA VAC ~~LOC~~ INJ
0.5000 mL | INJECTION | Freq: Once | SUBCUTANEOUS | Status: DC
  Filled 2016-08-08 (×2): qty 0.5

## 2016-08-08 MED ORDER — DOCUSATE SODIUM 100 MG PO CAPS: 100.0000 mg | ORAL_CAPSULE | Freq: Two times a day (BID) | ORAL | 0 refills | Status: DC

## 2016-08-08 MED ORDER — IBUPROFEN 600 MG PO TABS: 600.0000 mg | ORAL_TABLET | Freq: Four times a day (QID) | ORAL | 0 refills | Status: DC

## 2016-08-08 NOTE — Lactation Note (Signed)
This note was copied from a baby's chart. Lactation Consultation Note  Patient Name: Girl Camesha Farooq [id number] Date: 08/08/2016 Reason for consult: Follow-up assessment;Hyperbilirubinemia Baby is feeding frequently but sleepy at breast per mom.  Symphony pump set up and initiated for added stimulation and possible calories for baby.  Baby remains on phototherapy.  Mom pumped 6 mls of transitional milk from each breast.  Baby fed 6 mls with curved tip syringe.  Instructed mom to BF baby on cue, post pump every 3 hours and supplement with expressed milk.  Mom will ask for help with supplement.  Report given to RN.  Maternal Data    Feeding    LATCH Score/Interventions                      Lactation Tools Discussed/Used Pump Review: Setup, frequency, and cleaning;Milk Storage Initiated by:: LC Date initiated:: 08/08/16   Consult Status Consult Status: Follow-up    Huston Foley 08/08/2016, 11:56 AM

## 2016-08-08 NOTE — Discharge Summary (Signed)
OB Discharge Summary     Patient Name: Joann Duncan DOB: 06/22/1995 MRN: [id number]  Date of admission: 08/06/2016 Delivering MD: Waldemar Dickens   Date of discharge: 08/08/2016  Admitting diagnosis: 38 WEEKS CTX  Intrauterine pregnancy: [redacted]w[redacted]d    Secondary diagnosis:  Principal Problem:   NSVD (normal spontaneous vaginal delivery) Active Problems:   Rubella non-immune status, antepartum   Leakage of amniotic fluid  Additional problems: None     Discharge diagnosis: Term Pregnancy Delivered                                                                                                Post partum procedures:MMR vaccine  Augmentation: None  Complications: None  Hospital course:  Onset of Labor With Vaginal Delivery     21y.o. yo [id number] 368w4das admitted in Active Labor on 08/06/2016. Patient had an uncomplicated labor course as follows:  Membrane Rupture Time/Date: 5:15 AM ,08/06/2016   Intrapartum Procedures: Episiotomy: None [1]                                         Lacerations:  Labial [10]  Patient had a delivery of a Viable infant. 08/06/2016  Information for the patient's newborn:  MaCaroleen, Stoermer0[[id number]]Delivery Method: Vaginal, Spontaneous Delivery (Filed from Delivery Summary)    Pateint had an uncomplicated postpartum course.  She is ambulating, tolerating a regular diet, passing flatus, and urinating well. Patient is discharged home in stable condition on 08/08/16.   Physical exam  Vitals:   08/06/16 2201 08/07/16 0549 08/07/16 1831 08/08/16 0625  BP: 119/76 (!) 111/59 124/64 116/78  Pulse: 81 75 74 82  Resp: _0 Temp: 97.9 F (36.6 C) 98.4 F (36.9 C) 98 F (36.7 C) 98.2 F (36.8 C)  TempSrc: Oral Oral Oral Oral  Weight:      Height:       General: alert, cooperative and no distress Lochia: appropriate Uterine Fundus: firm Incision: N/A DVT Evaluation: No evidence of DVT seen on physical exam. Negative Homan's  sign. No cords or calf tenderness. Labs: Lab Results  Component Value Date   WBC 19.3 (H) 08/06/2016   HGB 12.5 08/06/2016   HCT 36.9 08/06/2016   MCV 91.6 08/06/2016   PLT 323 08/06/2016   CMP Latest Ref Rng & Units 02/22/2014  Glucose 70 - 99 mg/dL 96  BUN 6 - 23 mg/dL 11  Creatinine 0.50 - 1.10 mg/dL 0.56  Sodium 137 - 147 mEq/L 138  Potassium 3.7 - 5.3 mEq/L 4.1  Chloride 96 - 112 mEq/L 101  CO2 19 - 32 mEq/L 25  Calcium 8.4 - 10.5 mg/dL 9.5  Total Protein 6.0 - 8.3 g/dL 7.7  Total Bilirubin 0.3 - 1.2 mg/dL 0.7  Alkaline Phos 39 - 117 U/L 94  AST 0 - 37 U/L 21  ALT 0 - 35 U/L 17    Discharge instruction: per After Visit Summary and "Baby and Me  Booklet".  After visit meds:  Allergies as of 08/08/2016   No Known Allergies     Medication List    TAKE these medications   acetaminophen 325 MG tablet Commonly known as:  TYLENOL Take 650 mg by mouth every 6 (six) hours as needed for headache.   docusate sodium 100 MG capsule Commonly known as:  COLACE Take 1 capsule (100 mg total) by mouth 2 (two) times daily.   ibuprofen 600 MG tablet Commonly known as:  ADVIL,MOTRIN Take 1 tablet (600 mg total) by mouth every 6 (six) hours.   prenatal vitamin w/FE, FA 27-1 MG Tabs tablet Take 1 tablet by mouth daily at 12 noon.       Diet: routine diet  Activity: Advance as tolerated. Pelvic rest for 6 weeks.   Outpatient follow up:6 weeks Follow up Appt:Future Appointments Date Time Provider [place]  08/08/2016 10:45 AM Roma Schanz, CNM [facility]   Follow up Visit:No Follow-up on file.  Postpartum contraception: Progesterone only pills  Newborn Data: Live born female  Birth Weight: 6 lb 1.5 oz (2765 g) APGAR: 8, 9  Baby Feeding: Breast Disposition:home with mother   08/08/2016 Katherine Basset, DO OB Fellow

## 2016-08-08 NOTE — Discharge Instructions (Signed)

## 2016-08-09 ENCOUNTER — Ambulatory Visit: Payer: Self-pay

## 2016-08-09 NOTE — Lactation Note (Signed)
This note was copied from a baby's chart. Lactation Consultation Note  Patient Name: Girl Demetrice Amstutz [id number] Date: 08/09/2016 Reason for consult: Follow-up assessment;Hyperbilirubinemia;Infant < 6lbs Demonstrated using breast compression for baby to obtain more depth with latch. Baby demonstrating good suckling bursts, audible swallows at breast, breast softening with baby nursing. After nursing for 25 minutes on left breast, baby fell asleep. Mom's milk is in. Advised Mom to keep BF with feeding ques but at least 8-12 times in 24 hours. Try to get baby to nurse from both breasts with feedings. Post pump as needed for comfort to prevent engorgement. Use ice packs as needed. If baby still hungry after nursing from both breasts, then offer EBM. Monitor void/stools. Call for assist as needed.   Maternal Data    Feeding Feeding Type: Breast Fed Length of feed: 25 min  LATCH Score/Interventions Latch: Grasps breast easily, tongue down, lips flanged, rhythmical sucking. Intervention(s): Assist with latch;Breast massage;Breast compression  Audible Swallowing: Spontaneous and intermittent  Type of Nipple: Everted at rest and after stimulation  Comfort (Breast/Nipple): Filling, red/small blisters or bruises, mild/mod discomfort  Problem noted: Filling;Cracked, bleeding, blisters, bruises;Mild/Moderate discomfort Interventions  (Cracked/bleeding/bruising/blister): Expressed breast milk to nipple Interventions (Mild/moderate discomfort): Pre-pump if needed;Post-pump  Hold (Positioning): Assistance needed to correctly position infant at breast and maintain latch.  LATCH Score: 8  Lactation Tools Discussed/Used Tools: Pump;61F feeding tube / Syringe Breast pump type: Double-Electric Breast Pump   Consult Status      Alfred Levins 08/09/2016, 2:34 PM

## 2016-08-09 NOTE — Lactation Note (Signed)
This note was copied from a baby's chart. Lactation Consultation Note  Patient Name: Joann Duncan ZOXWR'U Date: 08/09/2016 Reason for consult: Follow-up assessment;Hyperbilirubinemia;Infant < 6lbs Mom's breast are full, baby having difficulty with latch, breast compression helping. Mom has positional stripes bilateral. RN had assisted Mom with supplementing at breast with 5 fr feeding tube/syringe, baby took 3 ml at that time. This LC assisted with same and baby took an additional 4 ml. Mom left breast softening. Baby asleep. Due to fullness, LC advised Mom to pre-pump to soften nipple/aerola to help baby with latch. With next feeding call LC again to observe feeding to see if baby will sustain latch and is able to empty breast well. If so, will only have Mom pump to comfort after nursing, use ice packs prn and supplement if needed. Parents agree to work with plan. Baby is on bili blanket so supplementing may need to continue, will assess with next feeding. RN to take Mom ice packs.   Maternal Data    Feeding Feeding Type: Breast Milk Length of feed: 5 min  LATCH Score/Interventions Latch: Repeated attempts needed to sustain latch, nipple held in mouth throughout feeding, stimulation needed to elicit sucking reflex.  Audible Swallowing: A few with stimulation  Type of Nipple: Everted at rest and after stimulation  Comfort (Breast/Nipple): Filling, red/small blisters or bruises, mild/mod discomfort  Problem noted: Filling;Cracked, bleeding, blisters, bruises;Mild/Moderate discomfort Interventions  (Cracked/bleeding/bruising/blister): Expressed breast milk to nipple Interventions (Mild/moderate discomfort): Pre-pump if needed;Post-pump  Hold (Positioning): Assistance needed to correctly position infant at breast and maintain latch.  LATCH Score: 6  Lactation Tools Discussed/Used Tools: Pump;69F feeding tube / Syringe Breast pump type: Double-Electric Breast Pump   Consult  Status Consult Status: Follow-up Date: 08/09/16 Follow-up type: In-patient    Alfred Levins 08/09/2016, 10:51 AM

## 2016-08-09 NOTE — Lactation Note (Signed)
This note was copied from a baby's chart. Lactation Consultation Note  Patient Name: Joann Duncan ZOXWR'U Date: 08/09/2016 Reason for consult: Follow-up assessment;Hyperbilirubinemia;Infant < 6lbs Mom pumping when LC arrived. Mom receiving at least 20 ml so far. Mom reports she is BF with each feeding and pumping after baby has been to breast. Parents are giving baby supplement using curved tipped syringe. Baby under bili blankets but bili down to 10.7 at 63 hours which is low/intermediate zone. Per chart review, baby has been to breast 8 times in past 24 hours for 5-30 minutes, supplemented 2 times with EBM 6 ml each time. 2 voids/2 stools in past 24 hours, baby now 47 hours old. Discussed ways to supplement at breast instead of using curved tipped syringe and advised to continue to offer breast with feeding ques, at least 8-12 times in 24 hours/continue to post pump and give baby back any amount of EBM received. Mom to call with next feeding for LC to demonstrate how to supplement at breast.   Maternal Data    Feeding Feeding Type: Breast Fed Length of feed: 20 min  LATCH Score/Interventions                      Lactation Tools Discussed/Used Tools: Pump Breast pump type: Double-Electric Breast Pump   Consult Status Consult Status: Follow-up Date: 08/09/16 Follow-up type: In-patient    Alfred Levins 08/09/2016, 9:24 AM

## 2016-08-10 ENCOUNTER — Ambulatory Visit: Payer: Self-pay

## 2016-08-10 NOTE — Lactation Note (Signed)
This note was copied from a baby's chart. Lactation Consultation Note  Patient Name: Girl Erricka Falkner [id number] Date: 08/10/2016 Reason for consult: Follow-up assessment Mom reports baby is nursing well, nipple discomfort is improving. Breasts full at this visit, baby asleep, recently BF 0930. Encouraged Mom to pump to comfort and ice packs given. Stressed importance to [employer] of baby being at breast every 2-3 hours to prevent engorgement and protect milk supply. Once chart updated, Mom is putting baby to breast frequently and output is good. Baby has gained weight.  Encouraged to let baby BF on 1st breast up to 30 minutes to empty well, then offer 2nd breast. Only post pump as needed for comfort if baby does not BF on 2nd breast or if BF for 5-10 minutes and breast still feels over full.  Advised this will help her body regulate milk volume without so much pumping. Alternate 1st breast each feeding. Encouraged Mom to come to support group, call for questions/concerns.   Maternal Data    Feeding Feeding Type: Breast Fed Nipple Type: Slow - flow Length of feed: 30 min  LATCH Score/Interventions                      Lactation Tools Discussed/Used Tools: Pump Breast pump type: Double-Electric Breast Pump   Consult Status Consult Status: Complete Date: 08/10/16 Follow-up type: In-patient    Alfred Levins 08/10/2016, 10:34 AM

## 2016-09-01 ENCOUNTER — Encounter: Payer: Self-pay | Admitting: *Deleted

## 2016-09-19 ENCOUNTER — Encounter: Payer: Self-pay | Admitting: Adult Health

## 2016-09-19 ENCOUNTER — Ambulatory Visit (INDEPENDENT_AMBULATORY_CARE_PROVIDER_SITE_OTHER): Payer: BLUE CROSS/BLUE SHIELD | Admitting: Adult Health

## 2016-09-19 DIAGNOSIS — Z3202 Encounter for pregnancy test, result negative: Secondary | ICD-10-CM | POA: Diagnosis not present

## 2016-09-19 DIAGNOSIS — Z30011 Encounter for initial prescription of contraceptive pills: Secondary | ICD-10-CM | POA: Diagnosis not present

## 2016-09-19 LAB — POCT URINE PREGNANCY: Preg Test, Ur: NEGATIVE

## 2016-09-19 MED ORDER — NORETHIN-ETH ESTRAD-FE BIPHAS 1 MG-10 MCG / 10 MCG PO TABS: 1.0000 | ORAL_TABLET | Freq: Every day | ORAL | 4 refills | Status: AC

## 2016-09-19 NOTE — Progress Notes (Signed)
Patient ID: Joann GlassingMeredith Duncan, female   DOB: 09/08/1995, 21 y.o.   MRN: [id number] Joann NimrodMeredith is a 21 year old white female, married in for postpartum visit,She delivered at 38+4 weeks.A baby girl, Joann Duncan 6 lbs 1.5 oz.  Delivery Date: 08/06/16 Method of Delivery: Vaginal with labial sutures for labial tear  Sexual Activity since delivery: No  Method of Feeding: Bottle   Number of weeks bleeding post delivery: 3 weeks, period started 5/15.  Review of Systems: Patient denies any headaches, hearing loss, fatigue, blurred vision, shortness of breath, chest pain, abdominal pain, problems with bowel movements, urination, or intercourse. No joint pain or mood swings. Reviewed past medical,surgical, social and family history. Reviewed medications and allergies.   Depression Score: 2  BP 100/60 (BP Location: Left Arm, Patient Position: Sitting, Cuff Size: Normal)   Pulse 96   Ht 5\' 6"  (1.676 m)   Wt 116 lb 8 oz (52.8 kg)   LMP 09/16/2016   Breastfeeding? No   BMI 18.80 kg/m UPT negative, Skin warm and dry. Neck: mid line trachea, normal thyroid, good ROM, no lymphadenopathy noted. Lungs: clear to ausculation bilaterally. Cardiovascular: regular rate and rhythm. Pelvic Exam:   External genitalia is normal in appearance, no lesions.  The vagina has good color, moisture and rugae, no lesions.Urethra has no masses or tenderness noted. The cervix is bulbous.  Uterus is felt to be normal size, shape, and contour, well involuted.  No adnexal masses or tenderness noted.Bladder is non tender, no masses felt.   Impression:  Status post delivery, post partum check, depression screening, contraceptive management.   Plan:   Meds ordered this encounter  Medications  . Norethindrone-Ethinyl Estradiol-Fe Biphas (LO LOESTRIN FE) 1 MG-10 MCG / 10 MCG tablet    Sig: Take 1 tablet by mouth daily. Take 1 daily by mouth    Dispense:  3 Package    Refill:  4    BIN [id number], PCN [id number], GRP [id number],ID [id number]     Order Specific Question:   Supervising Provider    Answer:   Joann Duncan [[id number]]  Start taking Sunday, use condom til second pack Return in 4 months for first pap and physical and ROS

## 2016-10-02 ENCOUNTER — Telehealth: Payer: Self-pay | Admitting: *Deleted

## 2016-10-02 NOTE — Telephone Encounter (Signed)
Patient states her birth control is $300 for 3 months supply. Info given for [employer] Savings card. Will run through and see how much it will be. If not what she wants to pay, will call us back.

## 2017-01-20 ENCOUNTER — Other Ambulatory Visit: Payer: BLUE CROSS/BLUE SHIELD | Admitting: Adult Health
